# Patient Record
Sex: Male | Born: 1975 | Race: Black or African American | Hispanic: No | Smoking: Never smoker
Health system: Southern US, Community
[De-identification: ages and names within clinical notes are randomized; demographics above are authoritative.]

## PROBLEM LIST (undated history)

## (undated) DIAGNOSIS — F32A Depression, unspecified: Secondary | ICD-10-CM

## (undated) DIAGNOSIS — E559 Vitamin D deficiency, unspecified: Secondary | ICD-10-CM

## (undated) DIAGNOSIS — F419 Anxiety disorder, unspecified: Secondary | ICD-10-CM

## (undated) HISTORY — DX: Anxiety disorder, unspecified: F41.9

## (undated) HISTORY — PX: HERNIA REPAIR: SHX51

## (undated) HISTORY — DX: Depression, unspecified: F32.A

## (undated) HISTORY — DX: Vitamin D deficiency, unspecified: E55.9

---

## 2019-09-10 ENCOUNTER — Encounter: Payer: Self-pay | Admitting: Nurse Practitioner

## 2019-09-10 ENCOUNTER — Ambulatory Visit (INDEPENDENT_AMBULATORY_CARE_PROVIDER_SITE_OTHER): Payer: 59 | Admitting: Nurse Practitioner

## 2019-09-10 VITALS — BP 138/86 | HR 76 | Temp 98.8°F | Ht 75.2 in | Wt 196.2 lb

## 2019-09-10 DIAGNOSIS — E559 Vitamin D deficiency, unspecified: Secondary | ICD-10-CM

## 2019-09-10 DIAGNOSIS — Z13228 Encounter for screening for other metabolic disorders: Secondary | ICD-10-CM | POA: Diagnosis not present

## 2019-09-10 DIAGNOSIS — R1084 Generalized abdominal pain: Secondary | ICD-10-CM

## 2019-09-10 NOTE — Progress Notes (Signed)
  Subjective:     Patient ID: Garrett Jones , male    DOB: 1975-11-27 , 43 y.o.   MRN: [id number]   Chief Complaint  Patient presents with  . Establish Care  . Abdominal Pain    HPI  Here to reestablish care - was here previously.  Has not seen any primary care.  Currently works at the [employer] works night shift.  Has a live in girlfriend.  2 children.    Had an inguinal hernia - he went to [place] to have done Dr. Cleda Mccreedy (surgeon) - [facility] in [place]. 2 years ago on the right side.  PMH - seasonal allergies, ? Irritable bowel  [facility] - mother seasonal allergies, Father - MI, COVID 54 on 07/28/2019.    Alcohol - average 1 drink per week.  No cigarettes.  Does smoke marijuana on a daily basis since College.    Abdominal Pain This is a chronic problem. The current episode started more than 1 year ago. The onset quality is gradual. The quality of the pain is a sensation of fullness. Pertinent negatives include no belching, constipation, fever, headaches, nausea or vomiting. The pain is aggravated by drinking alcohol and eating. The pain is relieved by passing flatus. Treatments tried: probiotic.     History reviewed. No pertinent past medical history.   Family History  Problem Relation Age of Onset  . Allergies Mother   . Heart disease Father      Current Outpatient Medications:  .  Probiotic Product (PROBIOTIC DAILY PO), Take 1 tablet by mouth daily., Disp: , Rfl:    No Known Allergies   Review of Systems  Constitutional: Negative.  Negative for fever.  Respiratory: Negative.   Cardiovascular: Negative.  Negative for chest pain, palpitations and leg swelling.  Gastrointestinal: Negative for abdominal pain, constipation, nausea and vomiting.       Abdominal discomfort and feels like does not empty all the way after having a bowel movement.   Neurological: Negative for dizziness and headaches.  Psychiatric/Behavioral: Negative.      Today's Vitals   09/10/19  1535  BP: 138/86  Pulse: 76  Temp: 98.8 F (37.1 C)  TempSrc: Oral  SpO2: 98%  Weight: 196 lb 3.2 oz (89 kg)  Height: 6' 3.2" (1.91 m)  PainSc: 0-No pain   Body mass index is 24.39 kg/m.   Objective:  Physical Exam Constitutional:      Appearance: He is well-developed.  Neurological:     Mental Status: He is alert.  Psychiatric:        Mood and Affect: Mood normal.        Behavior: Behavior normal.         Assessment And Plan:  1. Vitamin D deficiency  He has a previous history of low vitamin d  Will check vitamin D level and supplement as needed.     Also encouraged to spend 15 minutes in the sun daily.  - Vitamin D (25 hydroxy)  2. Encounter for screening for metabolic disorder  - JQD64+RCVK - TSH - CBC without diff  3. Abdominal discomfort, generalized  Reports not feeling like he is emptying of stool completely  Will refer to GI for further evaluation  I have encouraged him to continue taking the probiotic daily and increase water intake.  Minette Brine, FNP    THE PATIENT IS ENCOURAGED TO PRACTICE SOCIAL DISTANCING DUE TO THE COVID-19 PANDEMIC.

## 2019-09-11 LAB — CMP14+EGFR
ALT: 16 IU/L (ref 0–44)
AST: 19 IU/L (ref 0–40)
Albumin/Globulin Ratio: 1.8 (ref 1.2–2.2)
Albumin: 4.1 g/dL (ref 4.0–5.0)
Alkaline Phosphatase: 75 IU/L (ref 39–117)
BUN/Creatinine Ratio: 12 (ref 9–20)
BUN: 15 mg/dL (ref 6–24)
Bilirubin Total: 0.4 mg/dL (ref 0.0–1.2)
CO2: 26 mmol/L (ref 20–29)
Calcium: 9.2 mg/dL (ref 8.7–10.2)
Chloride: 105 mmol/L (ref 96–106)
Creatinine, Ser: 1.29 mg/dL — ABNORMAL HIGH (ref 0.76–1.27)
GFR calc Af Amer: 77 mL/min/{1.73_m2} (ref >59)
GFR calc non Af Amer: 67 mL/min/{1.73_m2} (ref >59)
Globulin, Total: 2.3 g/dL (ref 1.5–4.5)
Glucose: 98 mg/dL (ref 65–99)
Potassium: 3.9 mmol/L (ref 3.5–5.2)
Sodium: 143 mmol/L (ref 134–144)
Total Protein: 6.4 g/dL (ref 6.0–8.5)

## 2019-09-11 LAB — CBC
Hematocrit: 42.7 % (ref 37.5–51.0)
Hemoglobin: 14.5 g/dL (ref 13.0–17.7)
MCH: 28.5 pg (ref 26.6–33.0)
MCHC: 34 g/dL (ref 31.5–35.7)
MCV: 84 fL (ref 79–97)
Platelets: 138 10*3/uL — ABNORMAL LOW (ref 150–450)
RBC: 5.09 x10E6/uL (ref 4.14–5.80)
RDW: 12.7 % (ref 11.6–15.4)
WBC: 5 10*3/uL (ref 3.4–10.8)

## 2019-09-11 LAB — VITAMIN D 25 HYDROXY (VIT D DEFICIENCY, FRACTURES): Vit D, 25-Hydroxy: 22.8 ng/mL — ABNORMAL LOW (ref 30.0–100.0)

## 2019-09-11 LAB — TSH: TSH: 1.97 u[IU]/mL (ref 0.450–4.500)

## 2019-09-14 MED ORDER — VITAMIN D (ERGOCALCIFEROL) 1.25 MG (50000 UNIT) PO CAPS: 50000.0000 [IU] | ORAL_CAPSULE | ORAL | 0 refills | Status: DC

## 2019-09-20 ENCOUNTER — Encounter: Payer: Self-pay | Admitting: Nurse Practitioner

## 2019-09-23 ENCOUNTER — Encounter: Payer: Self-pay | Admitting: Nurse Practitioner

## 2019-10-09 ENCOUNTER — Ambulatory Visit: Payer: Self-pay | Admitting: Nurse Practitioner

## 2019-10-21 ENCOUNTER — Ambulatory Visit (INDEPENDENT_AMBULATORY_CARE_PROVIDER_SITE_OTHER): Payer: 59 | Admitting: Nurse Practitioner

## 2019-10-21 ENCOUNTER — Other Ambulatory Visit (HOSPITAL_COMMUNITY)
Admission: RE | Admit: 2019-10-21 | Discharge: 2019-10-21 | Disposition: A | Discharge status: Home / Self Care | Payer: 59 | Source: Ambulatory Visit | Attending: Nurse Practitioner | Admitting: Nurse Practitioner

## 2019-10-21 ENCOUNTER — Other Ambulatory Visit: Payer: Self-pay

## 2019-10-21 ENCOUNTER — Encounter: Payer: Self-pay | Admitting: Nurse Practitioner

## 2019-10-21 VITALS — BP 126/82 | HR 76 | Temp 98.1°F | Ht 76.0 in | Wt 193.6 lb

## 2019-10-21 DIAGNOSIS — E559 Vitamin D deficiency, unspecified: Secondary | ICD-10-CM | POA: Diagnosis not present

## 2019-10-21 DIAGNOSIS — Z0000 Encounter for general adult medical examination without abnormal findings: Secondary | ICD-10-CM

## 2019-10-21 DIAGNOSIS — R3911 Hesitancy of micturition: Secondary | ICD-10-CM | POA: Insufficient documentation

## 2019-10-21 DIAGNOSIS — K649 Unspecified hemorrhoids: Secondary | ICD-10-CM

## 2019-10-21 DIAGNOSIS — Z125 Encounter for screening for malignant neoplasm of prostate: Secondary | ICD-10-CM

## 2019-10-21 DIAGNOSIS — R5383 Other fatigue: Secondary | ICD-10-CM

## 2019-10-21 DIAGNOSIS — Z1211 Encounter for screening for malignant neoplasm of colon: Secondary | ICD-10-CM | POA: Diagnosis not present

## 2019-10-21 HISTORY — DX: Hesitancy of micturition: R39.11

## 2019-10-21 HISTORY — DX: Unspecified hemorrhoids: K64.9

## 2019-10-21 HISTORY — DX: Other fatigue: R53.83

## 2019-10-21 LAB — POCT URINALYSIS DIPSTICK
Bilirubin, UA: NEGATIVE
Blood, UA: NEGATIVE
Glucose, UA: NEGATIVE
Ketones, UA: NEGATIVE
Nitrite, UA: NEGATIVE
Protein, UA: NEGATIVE
Spec Grav, UA: 1.025 (ref 1.010–1.025)
Urobilinogen, UA: 4 E.U./dL — AB
pH, UA: 7 (ref 5.0–8.0)

## 2019-10-21 NOTE — Patient Instructions (Signed)
Health Maintenance  Topic Date Due  . INFLUENZA VACCINE  02/03/2020 (Originally 06/06/2019)  . TETANUS/TDAP  07/17/2025  . HIV Screening  Completed   Health Maintenance, Male Adopting a healthy lifestyle and getting preventive care are important in promoting health and wellness. Ask your health care provider about:  The right schedule for you to have regular tests and exams.  Things you can do on your own to prevent diseases and keep yourself healthy. What should I know about diet, weight, and exercise? Eat a healthy diet   Eat a diet that includes plenty of vegetables, fruits, low-fat dairy products, and lean protein.  Do not eat a lot of foods that are high in solid fats, added sugars, or sodium. Maintain a healthy weight Body mass index (BMI) is a measurement that can be used to identify possible weight problems. It estimates body fat based on height and weight. Your health care provider can help determine your BMI and help you achieve or maintain a healthy weight. Get regular exercise Get regular exercise. This is one of the most important things you can do for your health. Most adults should:  Exercise for at least 150 minutes each week. The exercise should increase your heart rate and make you sweat (moderate-intensity exercise).  Do strengthening exercises at least twice a week. This is in addition to the moderate-intensity exercise.  Spend less time sitting. Even light physical activity can be beneficial. Watch cholesterol and blood lipids Have your blood tested for lipids and cholesterol at 44 years of age, then have this test every 5 years. You may need to have your cholesterol levels checked more often if:  Your lipid or cholesterol levels are high.  You are older than 44 years of age.  You are at high risk for heart disease. What should I know about cancer screening? Many types of cancers can be detected early and may often be prevented. Depending on your health  history and family history, you may need to have cancer screening at various ages. This may include screening for:  Colorectal cancer.  Prostate cancer.  Skin cancer.  Lung cancer. What should I know about heart disease, diabetes, and high blood pressure? Blood pressure and heart disease  High blood pressure causes heart disease and increases the risk of stroke. This is more likely to develop in people who have high blood pressure readings, are of African descent, or are overweight.  Talk with your health care provider about your target blood pressure readings.  Have your blood pressure checked: ? Every 3-5 years if you are 19-46 years of age. ? Every year if you are 45 years old or older.  If you are between the ages of 63 and 22 and are a current or former smoker, ask your health care provider if you should have a one-time screening for abdominal aortic aneurysm (AAA). Diabetes Have regular diabetes screenings. This checks your fasting blood sugar level. Have the screening done:  Once every three years after age 46 if you are at a normal weight and have a low risk for diabetes.  More often and at a younger age if you are overweight or have a high risk for diabetes. What should I know about preventing infection? Hepatitis B If you have a higher risk for hepatitis B, you should be screened for this virus. Talk with your health care provider to find out if you are at risk for hepatitis B infection. Hepatitis C Blood testing is recommended for:  Everyone born from [birth year] through 1965.  Anyone with known risk factors for hepatitis C. Sexually transmitted infections (STIs)  You should be screened each year for STIs, including gonorrhea and chlamydia, if: ? You are sexually active and are younger than 44 years of age. ? You are older than 44 years of age and your health care provider tells you that you are at risk for this type of infection. ? Your sexual activity has changed since  you were last screened, and you are at increased risk for chlamydia or gonorrhea. Ask your health care provider if you are at risk.  Ask your health care provider about whether you are at high risk for HIV. Your health care provider may recommend a prescription medicine to help prevent HIV infection. If you choose to take medicine to prevent HIV, you should first get tested for HIV. You should then be tested every 3 months for as long as you are taking the medicine. Follow these instructions at home: Lifestyle  Do not use any products that contain nicotine or tobacco, such as cigarettes, e-cigarettes, and chewing tobacco. If you need help quitting, ask your health care provider.  Do not use street drugs.  Do not share needles.  Ask your health care provider for help if you need support or information about quitting drugs. Alcohol use  Do not drink alcohol if your health care provider tells you not to drink.  If you drink alcohol: ? Limit how much you have to 0-2 drinks a day. ? Be aware of how much alcohol is in your drink. In the [place] one drink equals one 12 oz bottle of beer (355 mL), one 5 oz glass of wine (148 mL), or one 1 oz glass of hard liquor (44 mL). General instructions  Schedule regular health, dental, and eye exams.  Stay current with your vaccines.  Tell your health care provider if: ? You often feel depressed. ? You have ever been abused or do not feel safe at home. Summary  Adopting a healthy lifestyle and getting preventive care are important in promoting health and wellness.  Follow your health care provider's instructions about healthy diet, exercising, and getting tested or screened for diseases.  Follow your health care provider's instructions on monitoring your cholesterol and blood pressure. This information is not intended to replace advice given to you by your health care provider. Make sure you discuss any questions you have with your health care  provider. Document Released: 04/19/2008 Document Revised: 10/15/2018 Document Reviewed: 10/15/2018 Elsevier Patient Education  2020 [employer].

## 2019-10-21 NOTE — Progress Notes (Signed)
This visit occurred during the SARS-CoV-2 public health emergency.  Safety protocols were in place, including screening questions prior to the visit, additional usage of staff PPE, and extensive cleaning of exam room while observing appropriate contact time as indicated for disinfecting solutions.  Subjective:     Patient ID: Garrett Jones , male    DOB: 16-Nov-1975 , 43 y.o.   MRN: [id number]   Chief Complaint  Patient presents with  . Annual Exam    HPI  Here for HM.  He works for [employer].    Wt Readings from Last 3 Encounters: 10/21/19 : 193 lb 9.6 oz (87.8 kg) 09/10/19 : 196 lb 3.2 oz (89 kg)  He has not been to GI doctor.  He had an episode of hemorrhoids 2 weeks ago.      Men's preventive visit. Patient Health Questionnaire (PHQ-2) is    Office Visit from 10/21/2019 in [facility]  PHQ-2 Total Score  0     Patient is on a regular diet. Minimal exercise.    Marital status: Significant Other. Relevant history for alcohol use is:  Social History   Substance and Sexual Activity  Alcohol Use Yes  . Alcohol/week: 3.0 standard drinks  . Types: 3 Cans of beer per week   Comment: ocassionally   Relevant history for tobacco use is:  Social History   Tobacco Use  Smoking Status Never Smoker  Smokeless Tobacco Never Used   No past medical history on file.   Family History  Problem Relation Age of Onset  . Allergies Mother   . Heart disease Father      Current Outpatient Medications:  .  Probiotic Product (PROBIOTIC DAILY PO), Take 1 tablet by mouth daily., Disp: , Rfl:  .  Vitamin D, Ergocalciferol, (DRISDOL) 1.25 MG (50000 UT) CAPS capsule, Take 1 capsule (50,000 Units total) by mouth every 7 (seven) days., Disp: 12 capsule, Rfl: 0   No Known Allergies   Review of Systems  Constitutional: Negative.   HENT: Negative.   Eyes: Negative.   Respiratory: Negative.   Cardiovascular: Negative.  Negative for chest pain, palpitations and leg  swelling.  Gastrointestinal: Negative.   Endocrine: Negative.   Genitourinary: Negative.   Musculoskeletal: Negative.   Skin: Negative.   Neurological: Negative.   Hematological: Negative.   Psychiatric/Behavioral: Negative.      Today's Vitals   10/21/19 1454  BP: 126/82  Pulse: 76  Temp: 98.1 F (36.7 C)  TempSrc: Oral  Weight: 193 lb 9.6 oz (87.8 kg)  Height: 6\' 4"  (1.93 m)  PainSc: 0-No pain   Body mass index is 23.57 kg/m.   Objective:  Physical Exam Vitals reviewed.  Constitutional:      General: He is not in acute distress.    Appearance: Normal appearance.  HENT:     Head: Normocephalic and atraumatic.     Right Ear: Tympanic membrane, ear canal and external ear normal. There is no impacted cerumen.     Left Ear: Tympanic membrane, ear canal and external ear normal. There is no impacted cerumen.     Nose: Nose normal.  Cardiovascular:     Rate and Rhythm: Normal rate and regular rhythm.     Pulses: Normal pulses.     Heart sounds: Normal heart sounds. No murmur.  Pulmonary:     Effort: Pulmonary effort is normal. No respiratory distress.     Breath sounds: Normal breath sounds.  Abdominal:     General: Abdomen  is flat. Bowel sounds are normal. There is no distension.     Palpations: Abdomen is soft.  Genitourinary:    Prostate: Normal.     Rectum: Guaiac result negative.  Musculoskeletal:        General: No tenderness. Normal range of motion.     Cervical back: Normal range of motion and neck supple.  Skin:    General: Skin is warm.     Capillary Refill: Capillary refill takes less than 2 seconds.  Neurological:     General: No focal deficit present.     Mental Status: He is alert and oriented to person, place, and time.  Psychiatric:        Mood and Affect: Mood normal.        Behavior: Behavior normal.        Thought Content: Thought content normal.        Judgment: Judgment normal.         Assessment And Plan:   1. Health maintenance  examination . Behavior modifications discussed and diet history reviewed.   . Pt will continue to exercise regularly and modify diet with low GI, plant based foods and decrease intake of processed foods.  . Recommend intake of daily multivitamin, Vitamin D, and calcium.  . Recommend for preventive screenings, as well as recommend immunizations that include influenza, TDAP - POCT Urinalysis Dipstick (81002)  2. Vitamin D deficiency  Will check vitamin D level and supplement as needed.     Also encouraged to spend 15 minutes in the sun daily.   3. Hemorrhoids, unspecified hemorrhoid type  Manual exam done, unable to feel any hemorrhoids  4. 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 Encounter for prostate cancer screening  Manual prostate exam normal - PSA  6. Urinary hesitancy  Will check cytology and will check PSA to ensure not related to enlargement - Urine cytology ancillary only     [name], FNP    THE PATIENT IS ENCOURAGED TO PRACTICE SOCIAL DISTANCING DUE TO THE COVID-19 PANDEMIC.

## 2019-10-22 ENCOUNTER — Encounter: Payer: Self-pay | Admitting: General Surgery

## 2019-10-22 ENCOUNTER — Telehealth: Payer: Self-pay | Admitting: General Surgery

## 2019-10-22 ENCOUNTER — Ambulatory Visit (INDEPENDENT_AMBULATORY_CARE_PROVIDER_SITE_OTHER): Payer: 59 | Admitting: Physician Assistant

## 2019-10-22 ENCOUNTER — Encounter: Payer: Self-pay | Admitting: Physician Assistant

## 2019-10-22 VITALS — BP 122/60 | HR 74 | Temp 97.3°F | Ht 76.0 in | Wt 193.0 lb

## 2019-10-22 DIAGNOSIS — K921 Melena: Secondary | ICD-10-CM

## 2019-10-22 DIAGNOSIS — K649 Unspecified hemorrhoids: Secondary | ICD-10-CM

## 2019-10-22 DIAGNOSIS — K625 Hemorrhage of anus and rectum: Secondary | ICD-10-CM

## 2019-10-22 DIAGNOSIS — Z1211 Encounter for screening for malignant neoplasm of colon: Secondary | ICD-10-CM

## 2019-10-22 DIAGNOSIS — Z1159 Encounter for screening for other viral diseases: Secondary | ICD-10-CM

## 2019-10-22 LAB — VITAMIN B12: Vitamin B-12: 547 pg/mL (ref 232–1245)

## 2019-10-22 LAB — PSA: Prostate Specific Ag, Serum: 0.5 ng/mL (ref 0.0–4.0)

## 2019-10-22 LAB — TESTOSTERONE: Testosterone: 432 ng/dL (ref 264–916)

## 2019-10-22 MED ORDER — NA SULFATE-K SULFATE-MG SULF 17.5-3.13-1.6 GM/177ML PO SOLN: 1.0000 | Freq: Once | ORAL | 0 refills | Status: AC

## 2019-10-22 NOTE — Patient Instructions (Signed)
If you are age 44 or older, your body mass index should be between 23-30. Your Body mass index is 23.49 kg/m. If this is out of the aforementioned range listed, please consider follow up with your Primary Care Provider.  If you are age 77 or younger, your body mass index should be between 19-25. Your Body mass index is 23.49 kg/m. If this is out of the aformentioned range listed, please consider follow up with your Primary Care Provider.   You have been scheduled for a colonoscopy. Please follow written instructions given to you at your visit today.  Please pick up your prep supplies at the pharmacy within the next 1-3 days. If you use inhalers (even only as needed), please bring them with you on the day of your procedure.  Try and use over the counter Recticare 3-4 times a dayas needed for hemrroids.  Thank you for choosing me and [facility]

## 2019-10-22 NOTE — Progress Notes (Signed)
Subjective:    Patient ID: Garrett Jones, male    DOB: [birth year], 43 y.o.   MRN: [id number]  HPI Garrett Jones is a pleasant 42 year old African-American male, new to GI today referred by Delfino Lovett, FNP for change in bowel habits and scant hematochezia. Patient has not had any prior GI evaluation. He did undergo right inguinal hernia repair in 2019. Family history is negative for colon cancer and polyps as far as he is aware. Patient says that he had an episode with a small amount of bright red blood noted on the tissue a couple of weeks ago.  He is also had some recent anal rectal burning which he thinks is secondary to hemorrhoids. He had been having some stomach upset which he describes as an unsettled feeling and somewhat more frequent bowel movements recently.  He started taking a probiotic which he feels is definitely helping.  He has had a sense of incomplete bowel evacuation recently, no constipation.  Review of Systems Pertinent positive and negative review of systems were noted in the above HPI section.  All other review of systems was otherwise negative.  Outpatient Encounter Medications as of 10/22/2019  Medication Sig  . Probiotic Product (PROBIOTIC DAILY PO) Take 1 tablet by mouth daily.  . Vitamin D, Ergocalciferol, (DRISDOL) 1.25 MG (50000 UT) CAPS capsule Take 1 capsule (50,000 Units total) by mouth every 7 (seven) days.  . Na Sulfate-K Sulfate-Mg Sulf 17.5-3.13-1.6 GM/177ML SOLN Take 1 kit by mouth once for 1 dose.   No facility-administered encounter medications on file as of 10/22/2019.   No Known Allergies Patient Active Problem List   Diagnosis Date Noted  . Urinary hesitancy 10/21/2019  . Fatigue 10/21/2019  . Hemorrhoids 10/21/2019  . Vitamin D deficiency 10/21/2019   Social History   Socioeconomic History  . Marital status: Significant Other    Spouse name: Not on file  . Number of children: 2  . Years of education: Not on file  . Highest education level:  Not on file  Occupational History  . Occupation: Scientist, research (medical)   Tobacco Use  . Smoking status: Never Smoker  . Smokeless tobacco: Never Used  Substance and Sexual Activity  . Alcohol use: Yes    Alcohol/week: 3.0 standard drinks    Types: 3 Cans of beer per week    Comment: ocassionally  . Drug use: Yes    Frequency: 7.0 times per week    Types: Marijuana  . Sexual activity: Not on file  Other Topics Concern  . Not on file  Social History Narrative  . Not on file   Social Determinants of Health   Financial Resource Strain:   . Difficulty of Paying Living Expenses: Not on file  Food Insecurity:   . Worried About Charity fundraiser in the Last Year: Not on file  . Ran Out of Food in the Last Year: Not on file  Transportation Needs:   . Lack of Transportation (Medical): Not on file  . Lack of Transportation (Non-Medical): Not on file  Physical Activity:   . Days of Exercise per Week: Not on file  . Minutes of Exercise per Session: Not on file  Stress:   . Feeling of Stress : Not on file  Social Connections:   . Frequency of Communication with Friends and Family: Not on file  . Frequency of Social Gatherings with Friends and Family: Not on file  . Attends Religious Services: Not on file  . Active Member of  Clubs or Organizations: Not on file  . Attends Archivist Meetings: Not on file  . Marital Status: Not on file  Intimate Partner Violence:   . Fear of Current or Ex-Partner: Not on file  . Emotionally Abused: Not on file  . Physically Abused: Not on file  . Sexually Abused: Not on file    Garrett Jones's family history includes Allergies in his mother; Heart disease in his father; Hypertension in his father; Prostate cancer in his maternal grandfather.      Objective:    Vitals:   10/22/19 1502  BP: 122/60  Pulse: 74  Temp: (!) 97.3 F (36.3 C)    Physical Exam Well-developed well-nourished African-American  male in no acute distress.   Weight 193,  BMI 23.4  HEENT; nontraumatic normocephalic, EOMI, PE R LA, sclera anicteric. Oropharynx; not examined/mask/Covid Neck; supple, no JVD Cardiovascular; regular rate and rhythm with S1-S2, no murmur rub or gallop Pulmonary; Clear bilaterally Abdomen; soft, nontender, nondistended, no palpable mass or hepatosplenomegaly, bowel sounds are active Rectal; not done today Skin; benign exam, no jaundice rash or appreciable lesions Extremities; no clubbing cyanosis or edema skin warm and dry Neuro/Psych; alert and oriented x4, grossly nonfocal mood and affect appropriate       Assessment & Plan:   #54 43 year old African-American male with recent mild change in bowel habits with somewhat more frequent bowel movements and and on settled feeling in the abdomen which has improved with probiotics. Intermittent incomplete bowel evacuation and one recent episode of small-volume hematochezia.  Hematochezia may have been secondary to hemorrhoids, rule out occult colon lesion.  #2 status post right inguinal hernia repair  Plan; continue daily probiotic. Patient was advised to try RectiCare OTC for hemorrhoidal symptoms, 3-4 times daily as needed Patient will be scheduled for colonoscopy with Dr. Scarlette Shorts.  Procedure was discussed in detail with the patient including indications risks and benefits and he is agreeable to proceed  Alfredia Ferguson PA-C 10/22/2019   Cc: Minette Brine, FNP

## 2019-10-23 LAB — URINE CYTOLOGY ANCILLARY ONLY
Chlamydia: NEGATIVE
Comment: NEGATIVE
Comment: NEGATIVE
Comment: NORMAL
Neisseria Gonorrhea: NEGATIVE
Trichomonas: NEGATIVE

## 2019-10-23 NOTE — Progress Notes (Signed)
Assessment and plan reviewed 

## 2019-10-26 NOTE — Telephone Encounter (Signed)
Error

## 2019-10-26 NOTE — Telephone Encounter (Signed)
error 

## 2019-10-27 ENCOUNTER — Encounter: Payer: Self-pay | Admitting: Internal Medicine

## 2019-10-27 ENCOUNTER — Ambulatory Visit (INDEPENDENT_AMBULATORY_CARE_PROVIDER_SITE_OTHER): Payer: 59

## 2019-10-27 DIAGNOSIS — Z1159 Encounter for screening for other viral diseases: Secondary | ICD-10-CM

## 2019-10-28 LAB — SARS CORONAVIRUS 2 (TAT 6-24 HRS): SARS Coronavirus 2: NEGATIVE

## 2019-10-29 ENCOUNTER — Encounter: Payer: Self-pay | Admitting: Internal Medicine

## 2019-10-29 ENCOUNTER — Other Ambulatory Visit: Payer: Self-pay

## 2019-10-29 ENCOUNTER — Ambulatory Visit (AMBULATORY_SURGERY_CENTER): Payer: 59 | Admitting: Internal Medicine

## 2019-10-29 VITALS — BP 133/86 | HR 65 | Temp 99.3°F | Resp 15 | Ht 76.0 in | Wt 193.0 lb

## 2019-10-29 DIAGNOSIS — R194 Change in bowel habit: Secondary | ICD-10-CM | POA: Diagnosis not present

## 2019-10-29 DIAGNOSIS — K625 Hemorrhage of anus and rectum: Secondary | ICD-10-CM | POA: Diagnosis not present

## 2019-10-29 DIAGNOSIS — Z1211 Encounter for screening for malignant neoplasm of colon: Secondary | ICD-10-CM

## 2019-10-29 MED ORDER — SODIUM CHLORIDE 0.9 % IV SOLN: 500.0000 mL | Freq: Once | INTRAVENOUS | Status: DC

## 2019-10-29 NOTE — Patient Instructions (Addendum)
Handouts given for hemorrhoids and high fiber diet.  YOU HAD AN ENDOSCOPIC PROCEDURE TODAY AT [facility]:   Refer to the procedure report that was given to you for any specific questions about what was found during the examination.  If the procedure report does not answer your questions, please call your gastroenterologist to clarify.  If you requested that your care partner not be given the details of your procedure findings, then the procedure report has been included in a sealed envelope for you to review at your convenience later.  YOU SHOULD EXPECT: Some feelings of bloating in the abdomen. Passage of more gas than usual.  Walking can help get rid of the air that was put into your GI tract during the procedure and reduce the bloating. If you had a lower endoscopy (such as a colonoscopy or flexible sigmoidoscopy) you may notice spotting of blood in your stool or on the toilet paper. If you underwent a bowel prep for your procedure, you may not have a normal bowel movement for a few days.  Please Note:  You might notice some irritation and congestion in your nose or some drainage.  This is from the oxygen used during your procedure.  There is no need for concern and it should clear up in a day or so.  SYMPTOMS TO REPORT IMMEDIATELY:   Following lower endoscopy (colonoscopy or flexible sigmoidoscopy):  Excessive amounts of blood in the stool  Significant tenderness or worsening of abdominal pains  Swelling of the abdomen that is new, acute  Fever of 100F or higher  For urgent or emergent issues, a gastroenterologist can be reached at any hour by calling [phone].   DIET:  We do recommend a small meal at first, but then you may proceed to your regular diet.  Drink plenty of fluids but you should avoid alcoholic beverages for 24 hours.  ACTIVITY:  You should plan to take it easy for the rest of today and you should NOT DRIVE or use heavy machinery until tomorrow  (because of the sedation medicines used during the test).    FOLLOW UP: Our staff will call the number listed on your records 48-72 hours following your procedure to check on you and address any questions or concerns that you may have regarding the information given to you following your procedure. If we do not reach you, we will leave a message.  We will attempt to reach you two times.  During this call, we will ask if you have developed any symptoms of COVID 19. If you develop any symptoms (ie: fever, flu-like symptoms, shortness of breath, cough etc.) before then, please call [phone].  If you test positive for Covid 19 in the 2 weeks post procedure, please call and report this information to [place].    If any biopsies were taken you will be contacted by phone or by letter within the next 1-3 weeks.  Please call us at [phone] if you have not heard about the biopsies in 3 weeks.    SIGNATURES/CONFIDENTIALITY: You and/or your care partner have signed paperwork which will be entered into your electronic medical record.  These signatures attest to the fact that that the information above on your After Visit Summary has been reviewed and is understood.  Full responsibility of the confidentiality of this discharge information lies with you and/or your care-partner.

## 2019-10-29 NOTE — Progress Notes (Signed)
Temp-JB VS-DT 

## 2019-10-29 NOTE — Op Note (Signed)
[facility] Patient Name: Garrett Jones Procedure Date: 10/29/2019 10:41 AM MRN: [id number] Endoscopist: Wilhemina Bonito. Marina Goodell , MD Age: 43 Referring MD:  Date of Birth: 11-22-1975 Gender: Male Account #: [id number] Procedure:                Colonoscopy Indications:              Colon cancer screening. Average risk. Incidental                            minor rectal bleeding, Incidental change in bowel                            habits noted. Improved since office visit Medicines:                Monitored Anesthesia Care Procedure:                Pre-Anesthesia Assessment:                           - Prior to the procedure, a History and Physical                            was performed, and patient medications and                            allergies were reviewed. The patient's tolerance of                            previous anesthesia was also reviewed. The risks                            and benefits of the procedure and the sedation                            options and risks were discussed with the patient.                            All questions were answered, and informed consent                            was obtained. Prior Anticoagulants: The patient has                            taken no previous anticoagulant or antiplatelet                            agents. ASA Grade Assessment: I - A normal, healthy                            patient. After reviewing the risks and benefits,                            the patient was deemed in satisfactory condition to  undergo the procedure.                           After obtaining informed consent, the colonoscope                            was passed under direct vision. Throughout the                            procedure, the patient's blood pressure, pulse, and                            oxygen saturations were monitored continuously. The                            Colonoscope was introduced  through the anus and                            advanced to the the cecum, identified by                            appendiceal orifice and ileocecal valve. The                            ileocecal valve, appendiceal orifice, and rectum                            were photographed. The quality of the bowel                            preparation was excellent. The colonoscopy was                            performed without difficulty. The patient tolerated                            the procedure well. The bowel preparation used was                            SUPREP via split dose instruction. Scope In: 10:56:26 AM Scope Out: 11:09:35 AM Scope Withdrawal Time: 0 hours 9 minutes 13 seconds  Total Procedure Duration: 0 hours 13 minutes 9 seconds  Findings:                 The entire examined colon appeared normal on direct                            and retroflexion views. Internal hemorrhoids present Complications:            No immediate complications. Estimated blood loss:                            None. Estimated Blood Loss:     Estimated blood loss: none. Impression:               - The entire examined  colon is normal on direct and                            retroflexion views. Internal hemorrhoids present                           - No specimens collected. Recommendation:           - Repeat colonoscopy in 10 years for screening                            purposes.                           - Patient has a contact number available for                            emergencies. The signs and symptoms of potential                            delayed complications were discussed with the                            patient. Return to normal activities tomorrow.                            Written discharge instructions were provided to the                            patient.                           - Resume previous diet.                           - Continue present medications. Docia Chuck.  Henrene Pastor, MD 10/29/2019 11:18:10 AM This report has been signed electronically.

## 2019-10-29 NOTE — Progress Notes (Signed)
Report to PACU, RN, vss, BBS= Clear.  

## 2019-11-03 ENCOUNTER — Telehealth: Payer: Self-pay

## 2019-11-03 NOTE — Telephone Encounter (Signed)
  Follow up Call-  Call back number 10/29/2019  Post procedure Call Back phone  # [phone]  Permission to leave phone message Yes  Some recent data might be hidden     Patient questions:  Do you have a fever, pain , or abdominal swelling? No. Pain Score  0 *  Have you tolerated food without any problems? Yes.    Have you been able to return to your normal activities? Yes.    Do you have any questions about your discharge instructions: Diet   No. Medications  No. Follow up visit  No.  Do you have questions or concerns about your Care? No.  Actions: * If pain score is 4 or above: No action needed, pain <4. 1. Have you developed a fever since your procedure? no  2.   Have you had an respiratory symptoms (SOB or cough) since your procedure? no  3.   Have you tested positive for COVID 19 since your procedure no  4.   Have you had any family members/close contacts diagnosed with the COVID 19 since your procedure?  no   If yes to any of these questions please route to Joylene John, RN and Alphonsa Gin, Therapist, sports.

## 2019-11-03 NOTE — Telephone Encounter (Signed)
Mailbox was full

## 2019-12-04 ENCOUNTER — Other Ambulatory Visit: Payer: Self-pay | Admitting: Nurse Practitioner

## 2020-01-19 ENCOUNTER — Encounter: Payer: Self-pay | Admitting: Nurse Practitioner

## 2020-01-19 ENCOUNTER — Other Ambulatory Visit: Payer: Self-pay

## 2020-01-19 ENCOUNTER — Ambulatory Visit (INDEPENDENT_AMBULATORY_CARE_PROVIDER_SITE_OTHER): Payer: 59 | Admitting: Nurse Practitioner

## 2020-01-19 VITALS — BP 122/80 | HR 66 | Temp 98.2°F | Ht 76.0 in | Wt 199.3 lb

## 2020-01-19 DIAGNOSIS — E559 Vitamin D deficiency, unspecified: Secondary | ICD-10-CM

## 2020-01-19 DIAGNOSIS — R41840 Attention and concentration deficit: Secondary | ICD-10-CM | POA: Diagnosis not present

## 2020-01-19 DIAGNOSIS — F4321 Adjustment disorder with depressed mood: Secondary | ICD-10-CM | POA: Diagnosis not present

## 2020-01-19 MED ORDER — MAGNESIUM 250 MG PO TABS: ORAL_TABLET | ORAL | 2 refills | Status: DC

## 2020-01-19 NOTE — Progress Notes (Signed)
This visit occurred during the SARS-CoV-2 public health emergency.  Safety protocols were in place, including screening questions prior to the visit, additional usage of staff PPE, and extensive cleaning of exam room while observing appropriate contact time as indicated for disinfecting solutions.  Subjective:     Patient ID: Garrett Jones , male    DOB: 01-20-1976 , 44 y.o.   MRN: [id number]   Chief Complaint  Patient presents with  . vitamin d    patient is here for a 3 month f/u     HPI  Here for vitamin d recheck.     Past Medical History:  Diagnosis Date  . Vitamin D deficiency      Family History  Problem Relation Age of Onset  . Allergies Mother   . Heart disease Father   . Hypertension Father   . Prostate cancer Maternal Grandfather   . Colon cancer Neg Hx   . Esophageal cancer Neg Hx   . Rectal cancer Neg Hx   . Stomach cancer Neg Hx      Current Outpatient Medications:  .  Probiotic Product (PROBIOTIC DAILY PO), Take 1 tablet by mouth daily., Disp: , Rfl:  .  Vitamin D, Ergocalciferol, (DRISDOL) 1.25 MG (50000 UNIT) CAPS capsule, TAKE 1 CAPSULE BY MOUTH EVERY 7 DAYS, Disp: 12 capsule, Rfl: 0   No Known Allergies   Review of Systems   Today's Vitals   01/19/20 1001  BP: 122/80  Pulse: 66  Temp: 98.2 F (36.8 C)  TempSrc: Oral  Weight: 199 lb 4.8 oz (90.4 kg)  Height: 6\' 4"  (1.93 m)  PainSc: 0-No pain   Body mass index is 24.26 kg/m.   Objective:  Physical Exam Constitutional:      Appearance: Normal appearance.  Cardiovascular:     Rate and Rhythm: Normal rate and regular rhythm.     Pulses: Normal pulses.     Heart sounds: Normal heart sounds. No murmur.  Pulmonary:     Effort: Pulmonary effort is normal. No respiratory distress.     Breath sounds: Normal breath sounds.  Skin:    Capillary Refill: Capillary refill takes less than 2 seconds.  Neurological:     General: No focal deficit present.     Mental Status: He is alert and  oriented to person, place, and time.     Cranial Nerves: No cranial nerve deficit.  Psychiatric:        Mood and Affect: Mood normal.        Behavior: Behavior normal.        Thought Content: Thought content normal.        Judgment: Judgment normal.     Comments: He is tearful during the visit          Assessment And Plan:     1. Vitamin D deficiency  Chronic, will recheck vitamin d level - VITAMIN D 25 Hydroxy (Vit-D Deficiency, Fractures)  2. Concentration deficit  Will have challenges with remaining focused.  3. Grief  He is having a difficult time with the loss of his father, he seems to be overwhelmed and is tearful during the visit.  I have offered to take him out of work for a couple weeks to allow him some time to grieve.  I have also offered to refer him for grief counseling at this time he declines   , FNP    THE PATIENT IS ENCOURAGED TO PRACTICE SOCIAL DISTANCING DUE TO THE COVID-19 PANDEMIC.

## 2020-01-20 ENCOUNTER — Other Ambulatory Visit: Payer: Self-pay

## 2020-01-20 LAB — VITAMIN D 25 HYDROXY (VIT D DEFICIENCY, FRACTURES): Vit D, 25-Hydroxy: 56.3 ng/mL (ref 30.0–100.0)

## 2020-01-20 MED ORDER — VITAMIN D (ERGOCALCIFEROL) 1.25 MG (50000 UNIT) PO CAPS: ORAL_CAPSULE | ORAL | 0 refills | Status: DC

## 2020-02-03 ENCOUNTER — Telehealth: Payer: Self-pay

## 2020-02-03 NOTE — Telephone Encounter (Signed)
Patient notified that his forms have been completed and he can pick them up. YL,RMA

## 2020-02-23 ENCOUNTER — Other Ambulatory Visit: Payer: Self-pay | Admitting: Internal Medicine

## 2020-04-25 ENCOUNTER — Other Ambulatory Visit: Payer: Self-pay

## 2020-04-25 ENCOUNTER — Encounter: Payer: Self-pay | Admitting: Nurse Practitioner

## 2020-04-25 ENCOUNTER — Ambulatory Visit (INDEPENDENT_AMBULATORY_CARE_PROVIDER_SITE_OTHER): Payer: 59 | Admitting: Nurse Practitioner

## 2020-04-25 VITALS — BP 120/64 | HR 62 | Temp 98.0°F | Ht 76.0 in | Wt 202.4 lb

## 2020-04-25 DIAGNOSIS — M5442 Lumbago with sciatica, left side: Secondary | ICD-10-CM | POA: Diagnosis not present

## 2020-04-25 DIAGNOSIS — E559 Vitamin D deficiency, unspecified: Secondary | ICD-10-CM | POA: Diagnosis not present

## 2020-04-25 DIAGNOSIS — Z1159 Encounter for screening for other viral diseases: Secondary | ICD-10-CM

## 2020-04-25 MED ORDER — PREDNISONE 10 MG (21) PO TBPK: ORAL_TABLET | ORAL | 0 refills | Status: DC

## 2020-04-25 NOTE — Progress Notes (Signed)
This visit occurred during the SARS-CoV-2 public health emergency.  Safety protocols were in place, including screening questions prior to the visit, additional usage of staff PPE, and extensive cleaning of exam room while observing appropriate contact time as indicated for disinfecting solutions.  Subjective:     Patient ID: Garrett Jones , male    DOB: 02-19-1976 , 44 y.o.   MRN: [id number]   Chief Complaint  Patient presents with  . vitamin d recheck    HPI  Here for vitamin d recheck. He reports that he is doing well and started working out recently. He is trying to get in the sun more and is feeling better with his mood as well. He is also cutting his lawn to get more sun exposure.   He has a compliant of a lower back ache that has some radiculopathy to the left thigh.The pain stated after he started his workout routine. He has tried epsom salt and has started to go to the chiropractor. Noticed after doing an exercise while working out he pulled. He reports a pain of 3/10 that is dull and ache. He took ibuprofen but doesn't take a lot of medication and wants to get a massage but they are booked up currently. We discussed using ice and heat alternating and also taking ibuprofen for the pain. He had to take 2 weeks off, this is going on the 3rd week.      Past Medical History:  Diagnosis Date  . Vitamin D deficiency      Family History  Problem Relation Age of Onset  . Allergies Mother   . Heart disease Father   . Hypertension Father   . Prostate cancer Maternal Grandfather   . Colon cancer Neg Hx   . Esophageal cancer Neg Hx   . Rectal cancer Neg Hx   . Stomach cancer Neg Hx      Current Outpatient Medications:  Marland Kitchen  Vitamin D, Ergocalciferol, (DRISDOL) 1.25 MG (50000 UNIT) CAPS capsule, TAKE 1 CAPSULE BY MOUTH EVERY 7 DAYS, Disp: 12 capsule, Rfl: 0   No Known Allergies   Review of Systems  Constitutional: Negative.   Eyes: Negative.   Respiratory: Negative.    Cardiovascular: Negative.   Gastrointestinal: Negative.   Endocrine: Negative.   Genitourinary: Negative.   Musculoskeletal: Positive for back pain.       Dull ache on lower left side  Allergic/Immunologic: Negative.   Neurological: Positive for numbness. Negative for weakness.       Reports some dull ache and numbness to the left thigh  Psychiatric/Behavioral: Negative.      Today's Vitals   04/25/20 0958  BP: 120/64  Pulse: 62  Temp: 98 F (36.7 C)  TempSrc: Oral  Weight: 202 lb 6.4 oz (91.8 kg)  Height: 6\' 4"  (1.93 m)  PainSc: 0-No pain   Body mass index is 24.64 kg/m.   Objective:  Physical Exam Constitutional:      Appearance: Normal appearance.  Cardiovascular:     Rate and Rhythm: Normal rate and regular rhythm.     Pulses: Normal pulses.     Heart sounds: Normal heart sounds. No murmur heard.   Pulmonary:     Effort: Pulmonary effort is normal. No respiratory distress.     Breath sounds: Normal breath sounds.  Musculoskeletal:        General: Swelling and tenderness present.     Cervical back: Normal range of motion.     Lumbar back: Swelling and tenderness  present.     Comments: Lower left back  Skin:    General: Skin is warm and dry.     Capillary Refill: Capillary refill takes less than 2 seconds.  Neurological:     General: No focal deficit present.     Mental Status: He is alert and oriented to person, place, and time.     Cranial Nerves: No cranial nerve deficit.  Psychiatric:        Attention and Perception: Attention normal.        Mood and Affect: Mood and affect normal.        Behavior: Behavior normal. Behavior is cooperative.        Thought Content: Thought content normal.        Cognition and Memory: Cognition normal.        Judgment: Judgment normal.     Comments: He is reports he is feeling better with his mood today         Assessment And Plan:     1. Vitamin D deficiency  Chronic, will recheck vitamin d level - VITAMIN D 25  Hydroxy (Vit-D Deficiency, Fractures)  2. Acute left-sided low back pain with left-sided sciatica  Tenderness to left lower back since has been 2 weeks with continued pain will treat with prednisone  Advised to take in am  - predniSONE (STERAPRED UNI-PAK 21 TAB) 10 MG (21) TBPK tablet; Take as directed  Dispense: 21 tablet; Refill: 0  3. Encounter for hepatitis C screening test for low risk patient  Will check for Hepatitis C screening due to being born between the years [birth year] - Hepatitis C antibody   Marylu Lund, RN    Minette Brine, DNP, FNP-BC   THE PATIENT IS ENCOURAGED TO PRACTICE SOCIAL DISTANCING DUE TO THE COVID-19 PANDEMIC.

## 2020-04-26 LAB — VITAMIN D 25 HYDROXY (VIT D DEFICIENCY, FRACTURES): Vit D, 25-Hydroxy: 62 ng/mL (ref 30.0–100.0)

## 2020-04-26 LAB — HEPATITIS C ANTIBODY: Hep C Virus Ab: <0.1 s/co ratio (ref 0.0–0.9)

## 2020-05-22 ENCOUNTER — Other Ambulatory Visit: Payer: Self-pay | Admitting: Nurse Practitioner

## 2020-06-30 ENCOUNTER — Telehealth: Payer: Self-pay

## 2020-06-30 NOTE — Telephone Encounter (Signed)
Patient notified that his forms have been completed and faxed and I have also placed them up front in the bin for him to pick up on Monday per pt. Marijo Sanes

## 2020-07-28 ENCOUNTER — Telehealth (INDEPENDENT_AMBULATORY_CARE_PROVIDER_SITE_OTHER): Payer: 59 | Admitting: Nurse Practitioner

## 2020-07-28 ENCOUNTER — Other Ambulatory Visit: Payer: Self-pay

## 2020-07-28 ENCOUNTER — Telehealth: Payer: Self-pay

## 2020-07-28 DIAGNOSIS — U071 COVID-19: Secondary | ICD-10-CM | POA: Diagnosis not present

## 2020-07-28 DIAGNOSIS — G4709 Other insomnia: Secondary | ICD-10-CM | POA: Diagnosis not present

## 2020-07-28 HISTORY — DX: COVID-19: U07.1

## 2020-07-28 MED ORDER — TRAZODONE HCL 50 MG PO TABS: ORAL_TABLET | ORAL | 0 refills | Status: DC

## 2020-07-28 MED ORDER — VITAMIN D (ERGOCALCIFEROL) 1.25 MG (50000 UNIT) PO CAPS: 50000.0000 [IU] | ORAL_CAPSULE | ORAL | 0 refills | Status: DC

## 2020-07-28 NOTE — Progress Notes (Signed)
Virtual Visit via MyChart   This visit type was conducted due to national recommendations for restrictions regarding the COVID-19 Pandemic (e.g. social distancing) in an effort to limit this patient's exposure and mitigate transmission in our community.  Due to his co-morbid illnesses, this patient is at least at moderate risk for complications without adequate follow up.  This format is felt to be most appropriate for this patient at this time.  All issues noted in this document were discussed and addressed.  A limited physical exam was performed with this format.    This visit type was conducted due to national recommendations for restrictions regarding the COVID-19 Pandemic (e.g. social distancing) in an effort to limit this patient's exposure and mitigate transmission in our community.  Patients identity confirmed using two different identifiers.  This format is felt to be most appropriate for this patient at this time.  All issues noted in this document were discussed and addressed.  No physical exam was performed (except for noted visual exam findings with Video Visits).    Date:  08/04/2020   ID:  Garrett Jones, DOB March 01, 1976, MRN [id number]  Patient Location:  Home - spoke with Garrett Jones  Provider location:   Office    Chief Complaint:  Positive covid  History of Present Illness:    Garrett Jones is a 44 y.o. male who presents via video conferencing for a telehealth visit today.    The patient does not have symptoms concerning for COVID-19 infection (fever, chills, cough, or new shortness of breath).   Monday morning he woke up with a chills and fever, went to get tested. He is also having congestion. He does feel like his fever is improving.   Continues to have a sense of taste and smell. Intermittent coughing.  He did go to [facility] who azithromycin which he is not taking. He is taking Emergency.    He is unable to sleep he is not able to sleep more than 3 hours,  he will usually sleep 4-5 hours on a regular.  He will wake up without his alarm clock.  He is having difficulty with staying asleep.  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    He did received the covid vaccine.  He is out of work until October 1st.  He was at the [employer] in [place] this past weekend.      Past Medical History:  Diagnosis Date   Vitamin D deficiency    Past Surgical History:  Procedure Laterality Date   HERNIA REPAIR       No outpatient medications have been marked as taking for the 07/28/20 encounter (Video Visit) with [name], FNP.     Allergies:   Patient has no known allergies.   Social History   Tobacco Use   Smoking status: Never Smoker   Smokeless tobacco: Never Used  Vaping Use   Vaping Use: Never used  Substance Use Topics   Alcohol use: Yes    Alcohol/week: 3.0 standard drinks    Types: 3 Cans of beer per week    Comment: ocassionally   Drug use: Yes    Frequency: 7.0 times per week    Types: Marijuana    Comment: last used 10-28-19     Family Hx: The patient's family history includes Allergies in his mother; Heart disease in his father; Hypertension in his father; Prostate cancer in his maternal grandfather. There is no history  of Colon cancer, Esophageal cancer, Rectal cancer, or Stomach cancer.  ROS:   Please see the history of present illness.    Review of Systems  Constitutional: Positive for chills and fever. Negative for malaise/fatigue.  Respiratory: Positive for cough (intermittent). Negative for shortness of breath and wheezing.   Cardiovascular: Negative.   Musculoskeletal: Negative for myalgias (no longer having muscle aches).  Psychiatric/Behavioral: Negative.     All other systems reviewed and are negative.   Labs/Other Tests and Data Reviewed:    Recent Labs: 09/10/2019: ALT 16; BUN 15; Creatinine, Ser 1.29; Hemoglobin 14.5; Platelets 138; Potassium 3.9; Sodium 143; TSH 1.970    Recent Lipid Panel No results found for: CHOL, TRIG, HDL, CHOLHDL, LDLCALC, LDLDIRECT  Wt Readings from Last 3 Encounters:  04/25/20 202 lb 6.4 oz (91.8 kg)  01/19/20 199 lb 4.8 oz (90.4 kg)  10/29/19 193 lb (87.5 kg)     Exam:    Vital Signs:  There were no vitals taken for this visit.    Physical Exam Constitutional:      General: He is not in acute distress.    Appearance: Normal appearance.  HENT:     Nose:     Comments: He sounds like he has nasal congestion Pulmonary:     Effort: Pulmonary effort is normal. No respiratory distress.     Breath sounds: Normal breath sounds.  Neurological:     General: No focal deficit present.     Mental Status: He is alert and oriented to person, place, and time.     Cranial Nerves: No cranial nerve deficit.  Psychiatric:        Mood and Affect: Mood normal.        Behavior: Behavior normal.        Thought Content: Thought content normal.        Judgment: Judgment normal.     ASSESSMENT & PLAN:    1. COVID-19 virus infection  Overall doing well, encouraged to continue with staying well hydrated with fluids  Advised if has shortness of breath, chest pain or symptoms worsen to go to ER for further evaluation.   2. Other insomnia  Acute, may be related to covid - traZODone (DESYREL) 50 MG tablet; Take 1/2 tab - 1 tablet by mouth at bedtime as needed  Dispense: 30 tablet; Refill: 0  COVID-19 Education: The signs and symptoms of COVID-19 were discussed with the patient and how to seek care for testing (follow up with PCP or arrange E-visit).  The importance of social distancing was discussed today.  Patient Risk:   After full review of this patients clinical status, I feel that they are at least moderate risk at this time.  Time:   Today, I have spent  minutes/ seconds with the patient with telehealth technology discussing above diagnoses.     Medication Adjustments/Labs and Tests Ordered: Current medicines are reviewed  at length with the patient today.  Concerns regarding medicines are outlined above.   Tests Ordered: No orders of the defined types were placed in this encounter.   Medication Changes: Meds ordered this encounter  Medications   traZODone (DESYREL) 50 MG tablet    Sig: Take 1/2 tab - 1 tablet by mouth at bedtime as needed    Dispense:  30 tablet    Refill:  0   Vitamin D, Ergocalciferol, (DRISDOL) 1.25 MG (50000 UNIT) CAPS capsule    Sig: Take 1 capsule (50,000 Units total) by mouth every 7 (seven)  days.    Dispense:  12 capsule    Refill:  0    Disposition:  Follow up prn  Signed, [name], FNP

## 2020-07-28 NOTE — Telephone Encounter (Signed)
Patient consented to virtual appointment. YL,RMA 

## 2020-07-28 NOTE — Patient Instructions (Signed)
Take vitamin d, zinc, vitamin c and aspirin 81 mg daily.

## 2020-08-04 ENCOUNTER — Encounter: Payer: Self-pay | Admitting: Nurse Practitioner

## 2020-08-24 ENCOUNTER — Other Ambulatory Visit: Payer: Self-pay | Admitting: Nurse Practitioner

## 2020-08-24 DIAGNOSIS — G4709 Other insomnia: Secondary | ICD-10-CM

## 2020-08-24 NOTE — Telephone Encounter (Signed)
Please refill patient's prescription 

## 2020-10-17 ENCOUNTER — Other Ambulatory Visit: Payer: Self-pay | Admitting: Nurse Practitioner

## 2020-10-24 ENCOUNTER — Encounter: Payer: Self-pay | Admitting: Nurse Practitioner

## 2020-10-24 ENCOUNTER — Other Ambulatory Visit: Payer: Self-pay

## 2020-10-24 ENCOUNTER — Ambulatory Visit (INDEPENDENT_AMBULATORY_CARE_PROVIDER_SITE_OTHER): Payer: 59 | Admitting: Nurse Practitioner

## 2020-10-24 VITALS — BP 126/80 | HR 63 | Temp 98.2°F | Ht 76.0 in | Wt 206.4 lb

## 2020-10-24 DIAGNOSIS — R5383 Other fatigue: Secondary | ICD-10-CM

## 2020-10-24 DIAGNOSIS — E559 Vitamin D deficiency, unspecified: Secondary | ICD-10-CM

## 2020-10-24 DIAGNOSIS — Z0000 Encounter for general adult medical examination without abnormal findings: Secondary | ICD-10-CM | POA: Diagnosis not present

## 2020-10-24 DIAGNOSIS — Z125 Encounter for screening for malignant neoplasm of prostate: Secondary | ICD-10-CM

## 2020-10-24 DIAGNOSIS — Z8616 Personal history of COVID-19: Secondary | ICD-10-CM | POA: Diagnosis not present

## 2020-10-24 DIAGNOSIS — Z6825 Body mass index (BMI) 25.0-25.9, adult: Secondary | ICD-10-CM

## 2020-10-24 DIAGNOSIS — F4321 Adjustment disorder with depressed mood: Secondary | ICD-10-CM

## 2020-10-24 NOTE — Patient Instructions (Signed)

## 2020-10-24 NOTE — Progress Notes (Signed)
I,Yamilka [employer] as a Education administrator for [employer], FNP.,have documented all relevant documentation on the behalf of Minette Brine, FNP,as directed by  Minette Brine, FNP while in the presence of Minette Brine, [place]. This visit occurred during the SARS-CoV-2 public health emergency.  Safety protocols were in place, including screening questions prior to the visit, additional usage of staff PPE, and extensive cleaning of exam room while observing appropriate contact time as indicated for disinfecting solutions.  Subjective:     Patient ID: Garrett Jones , male    DOB: 10/25/1976 , 44 y.o.   MRN: [id number]   Chief Complaint  Patient presents with  . Annual Exam    HPI  Here for HM.  He had covid in October.  He will have cramps in his thigh.    Wt Readings from Last 3 Encounters: 10/24/20 : 206 lb 6.4 oz (93.6 kg) 04/25/20 : 202 lb 6.4 oz (91.8 kg) 01/19/20 : 199 lb 4.8 oz (90.4 kg)     Past Medical History:  Diagnosis Date  . Vitamin D deficiency      Family History  Problem Relation Age of Onset  . Allergies Mother   . Heart disease Father   . Hypertension Father   . Prostate cancer Maternal Grandfather   . Colon cancer Neg Hx   . Esophageal cancer Neg Hx   . Rectal cancer Neg Hx   . Stomach cancer Neg Hx      Current Outpatient Medications:  Marland Kitchen  Vitamin D, Ergocalciferol, (DRISDOL) 1.25 MG (50000 UNIT) CAPS capsule, TAKE 1 CAPSULE BY MOUTH EVERY 7 DAYS, Disp: 12 capsule, Rfl: 0   No Known Allergies   Men's preventive visit. Patient Health Questionnaire (PHQ-2) is  [place] Visit from 01/19/2020 in [facility]  PHQ-2 Total Score 0     Patient is on a Regular diet; he reports he only eats 2 times a day. He mostly drinks water. He has been trying to meditate a few times a week. He is not currently exercising.  Marital status: Significant Other. Relevant history for alcohol use is:  Social History   Substance and Sexual Activity   Alcohol Use Yes  . Alcohol/week: 3.0 standard drinks  . Types: 3 Cans of beer per week   Comment: ocassionally   Relevant history for tobacco use is:  Social History   Tobacco Use  Smoking Status Never Smoker  Smokeless Tobacco Never Used  .   Review of Systems  HENT: Negative.   Eyes: Negative.   Respiratory: Negative.   Cardiovascular: Negative.   Gastrointestinal: Negative.   Endocrine: Negative.   Genitourinary: Negative.   Musculoskeletal: Negative.   Allergic/Immunologic: Negative.   Neurological: Negative.   Hematological: Negative.   Psychiatric/Behavioral: Negative.      Today's Vitals   10/24/20 0939  BP: 126/80  Pulse: 63  Temp: 98.2 F (36.8 C)  TempSrc: Oral  Weight: 206 lb 6.4 oz (93.6 kg)  Height: _0  (1.93 m)  PainSc: 0-No pain   Body mass index is 25.12 kg/m.   Objective:  Physical Exam Vitals reviewed.  Constitutional:      Appearance: Normal appearance. He is obese.  HENT:     Head: Normocephalic and atraumatic.     Right Ear: Tympanic membrane, ear canal and external ear normal. There is no impacted cerumen.     Left Ear: Tympanic membrane, ear canal and external ear normal. There is no impacted cerumen.  Cardiovascular:  Rate and Rhythm: Normal rate and regular rhythm.     Pulses: Normal pulses.     Heart sounds: Normal heart sounds. No murmur heard.   Pulmonary:     Effort: Pulmonary effort is normal. No respiratory distress.     Breath sounds: Normal breath sounds. No wheezing.  Abdominal:     General: Abdomen is flat. Bowel sounds are normal. There is no distension.     Palpations: Abdomen is soft.  Genitourinary:    Prostate: Normal.     Rectum: Guaiac result negative.  Musculoskeletal:        General: Normal range of motion.     Cervical back: Normal range of motion and neck supple.  Skin:    General: Skin is warm.     Capillary Refill: Capillary refill takes less than 2 seconds.  Neurological:     General: No  focal deficit present.     Mental Status: He is alert and oriented to person, place, and time.     Cranial Nerves: No cranial nerve deficit.  Psychiatric:        Mood and Affect: Mood normal.        Behavior: Behavior normal.        Thought Content: Thought content normal.        Judgment: Judgment normal.          Assessment And Plan:    1. Encounter for general adult medical examination w/o abnormal findings . Behavior modifications discussed and diet history reviewed.   . Pt will continue to exercise regularly and modify diet with low GI, plant based foods and decrease intake of processed foods.  . Recommend intake of daily multivitamin, Vitamin D, and calcium.  . Recommend for preventive screenings, as well as recommend immunizations that include influenza, TDAP (up to date) - CMP14+EGFR - CBC no Diff  2. Encounter for prostate cancer screening - PSA  3. Vitamin D deficiency  Will check vitamin D level and supplement as needed.     Also encouraged to spend 15 minutes in the sun daily.  - Vitamin D (25 hydroxy)  4. Fatigue, unspecified type  Will send for a sleep study if normal will refer to psychiatry for further evaluation  Metabolic labs were normal previously will check again since has been a year - Ambulatory referral to Sleep Studies - TSH - Vitamin B12 - Testosterone, Total  5. History of COVID-19  6. Grief  Continues to have episodes of feeling down and having difficulty with focusing  He will bring in another FMLA form to update   7. BMI 25.0-25.9,adult  Just over normal, he is encouraged to increase physical activity and eat a healthy diet     Patient was given opportunity to ask questions. Patient verbalized understanding of the plan and was able to repeat key elements of the plan. All questions were answered to their satisfaction.    Teola Bradley, FNP, have reviewed all documentation for this visit. The documentation on 10/24/20 for the  exam, diagnosis, procedures, and orders are all accurate and complete.  THE PATIENT IS ENCOURAGED TO PRACTICE SOCIAL DISTANCING DUE TO THE COVID-19 PANDEMIC.

## 2020-10-25 ENCOUNTER — Other Ambulatory Visit: Payer: Self-pay

## 2020-10-25 DIAGNOSIS — R944 Abnormal results of kidney function studies: Secondary | ICD-10-CM

## 2020-10-25 LAB — LIPID PANEL
Chol/HDL Ratio: 2.5 ratio (ref 0.0–5.0)
Cholesterol, Total: 165 mg/dL (ref 100–199)
HDL: 66 mg/dL (ref >39)
LDL Chol Calc (NIH): 85 mg/dL (ref 0–99)
Triglycerides: 71 mg/dL (ref 0–149)
VLDL Cholesterol Cal: 14 mg/dL (ref 5–40)

## 2020-10-25 LAB — CBC
Hematocrit: 45.9 % (ref 37.5–51.0)
Hemoglobin: 15.5 g/dL (ref 13.0–17.7)
MCH: 28.9 pg (ref 26.6–33.0)
MCHC: 33.8 g/dL (ref 31.5–35.7)
MCV: 86 fL (ref 79–97)
Platelets: 149 10*3/uL — ABNORMAL LOW (ref 150–450)
RBC: 5.36 x10E6/uL (ref 4.14–5.80)
RDW: 12.8 % (ref 11.6–15.4)
WBC: 5.4 10*3/uL (ref 3.4–10.8)

## 2020-10-25 LAB — CMP14+EGFR
ALT: 11 IU/L (ref 0–44)
AST: 15 IU/L (ref 0–40)
Albumin/Globulin Ratio: 1.8 (ref 1.2–2.2)
Albumin: 4.5 g/dL (ref 4.0–5.0)
Alkaline Phosphatase: 78 IU/L (ref 44–121)
BUN/Creatinine Ratio: 12 (ref 9–20)
BUN: 16 mg/dL (ref 6–24)
Bilirubin Total: 0.9 mg/dL (ref 0.0–1.2)
CO2: 23 mmol/L (ref 20–29)
Calcium: 9.4 mg/dL (ref 8.7–10.2)
Chloride: 104 mmol/L (ref 96–106)
Creatinine, Ser: 1.31 mg/dL — ABNORMAL HIGH (ref 0.76–1.27)
GFR calc Af Amer: 75 mL/min/{1.73_m2} (ref >59)
GFR calc non Af Amer: 65 mL/min/{1.73_m2} (ref >59)
Globulin, Total: 2.5 g/dL (ref 1.5–4.5)
Glucose: 85 mg/dL (ref 65–99)
Potassium: 4.2 mmol/L (ref 3.5–5.2)
Sodium: 142 mmol/L (ref 134–144)
Total Protein: 7 g/dL (ref 6.0–8.5)

## 2020-10-25 LAB — TSH: TSH: 2.23 u[IU]/mL (ref 0.450–4.500)

## 2020-10-25 LAB — PSA: Prostate Specific Ag, Serum: 0.5 ng/mL (ref 0.0–4.0)

## 2020-10-25 LAB — TESTOSTERONE: Testosterone: 585 ng/dL (ref 264–916)

## 2020-10-25 LAB — VITAMIN B12: Vitamin B-12: 368 pg/mL (ref 232–1245)

## 2020-10-25 LAB — VITAMIN D 25 HYDROXY (VIT D DEFICIENCY, FRACTURES): Vit D, 25-Hydroxy: 31.1 ng/mL (ref 30.0–100.0)

## 2020-11-14 ENCOUNTER — Other Ambulatory Visit: Payer: Self-pay

## 2020-11-14 ENCOUNTER — Other Ambulatory Visit: Payer: 59

## 2020-11-14 DIAGNOSIS — R944 Abnormal results of kidney function studies: Secondary | ICD-10-CM

## 2020-11-14 LAB — BMP8+EGFR
BUN/Creatinine Ratio: 8 — ABNORMAL LOW (ref 9–20)
BUN: 10 mg/dL (ref 6–24)
CO2: 23 mmol/L (ref 20–29)
Calcium: 9.1 mg/dL (ref 8.7–10.2)
Chloride: 102 mmol/L (ref 96–106)
Creatinine, Ser: 1.19 mg/dL (ref 0.76–1.27)
GFR calc Af Amer: 85 mL/min/{1.73_m2} (ref >59)
GFR calc non Af Amer: 73 mL/min/{1.73_m2} (ref >59)
Glucose: 91 mg/dL (ref 65–99)
Potassium: 4 mmol/L (ref 3.5–5.2)
Sodium: 139 mmol/L (ref 134–144)

## 2020-11-29 ENCOUNTER — Ambulatory Visit (INDEPENDENT_AMBULATORY_CARE_PROVIDER_SITE_OTHER): Payer: 59 | Admitting: Neurology

## 2020-11-29 ENCOUNTER — Encounter: Payer: Self-pay | Admitting: Neurology

## 2020-11-29 VITALS — BP 149/93 | HR 70 | Ht 76.0 in | Wt 207.0 lb

## 2020-11-29 DIAGNOSIS — F4321 Adjustment disorder with depressed mood: Secondary | ICD-10-CM

## 2020-11-29 DIAGNOSIS — J301 Allergic rhinitis due to pollen: Secondary | ICD-10-CM | POA: Diagnosis not present

## 2020-11-29 DIAGNOSIS — R002 Palpitations: Secondary | ICD-10-CM

## 2020-11-29 DIAGNOSIS — F5104 Psychophysiologic insomnia: Secondary | ICD-10-CM | POA: Diagnosis not present

## 2020-11-29 NOTE — Progress Notes (Signed)
SLEEP MEDICINE CLINIC    Provider:  Melvyn Novasarmen  Annalisse Minkoff, MD  Primary Care Physician:  Arnette FeltsMoore, Janece, FNP [address] [place] [id number]     Referring Provider: Arnette FeltsMoore, Janece, Fnp [address] [place] [id number]          Chief Complaint according to patient   Patient presents with:    . New Patient (Initial Visit)     Patient presents today to discuss his sleep. Patient has never had a sleep study. Patient does not snore and falls asleep, but his mind is racing. Patient has trouble maintaining sleep since his father died of COVID in September 2020. Patient has tried trazodone but it did not like the side effects. Wakes up at 3 AM and doesn't want to .       HISTORY OF PRESENT ILLNESS:  Garrett Jones is a 45 y.o.  African American male patient who is seen on 11/29/2020 for an insomnia evaluation.   Chief concern according to patient :  Insomnia - Patient presents today to discuss his sleep. Patient has never had a sleep study. Patient does not snore and falls asleep, but his mind is racing. Patient has trouble maintaining sleep since his father died of COVID in September 2020. Patient has tried trazodone but it did not like the side effects. Wakes up at 3 AM and doesn't want to .      Garrett Jones is  a right--handed [place] male who  has a  medical history of Vitamin D deficiency, hay fever, rhinitis sinusitis, otitis media, dental braces. Inguinal Hernia and surgery, ear tubing, ear infection, GERD with belching, and now anxiety and depression. Waking up with palpitations.   His father died at age 46, unexpected, of COVID. His father's mother died at age 46 in February. Family medical /sleep history: No other family member on CPAP with OSA, mother and sister with insomnia.    Social history: Patient is working for [employer] and used to work night shifts- until 20 month ago when he changed to days. 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 Family status is single with one daughter - and one son. Pets are not present. Tobacco use- marihuana  ETOH use - 2-5 a week ,  Caffeine intake in form of Coffee( /) Soda( 1-2 week or less) Tea ( herbal ) or energy drinks. Regular exercise at home , 40 push ups. Hobbies : basketball.      Sleep habits are as follows: he returns from work at 3.30 - watches Tv and falls asleep until 8.30 PM- The patient's dinner time is between 5-7 PM. The patient goes to bed at 11-12  PM and continues to sleep for 3-4  hours, wakes for unknown reasons. Not bathroom breaks.   The preferred sleep position is sideways , with the support of 1 pillow. Dreams are reportedly rare but he often wakes up with palpitations. 5 AM is the usual rise time. The patient wakes up with an alarm.  He  reports not feeling refreshed or restored in AM, with symptoms such as dry mouth , rarely  morning headaches and residual fatigue.  Naps are takenfrequently, lasting from 1-3 hours and are more refreshing than nocturnal sleep.    Review of Systems: Out of a complete 14 system review, the patient complains of only the following symptoms, and all other reviewed systems are negative.:  Fatigue, fragmented sleep, acquired Insomnia -  palpitations.    How likely are you to doze in the following situations: 0 = not likely, 1 = slight chance, 2 = moderate chance, 3 = high chance   Sitting and Reading? Watching Television? Sitting inactive in a public place (theater or meeting)? As a passenger in a car for an hour without a break? Lying down in the afternoon when circumstances permit? Sitting and talking to someone? Sitting quietly after lunch without alcohol? In a car, while stopped for a few minutes in traffic?   Total =11/ 24 points   FSS endorsed at 37/ 63 points.   Social History   Socioeconomic History  . Marital status: Significant Other    Spouse name: Not on file  . Number of children: 2  . Years of education:  Not on file  . Highest education level: Not on file  Occupational History  . Occupation: Research scientist (physical sciences)   Tobacco Use  . Smoking status: Never Smoker  . Smokeless tobacco: Never Used  Vaping Use  . Vaping Use: Never used  Substance and Sexual Activity  . Alcohol use: Yes    Alcohol/week: 3.0 standard drinks    Types: 3 Cans of beer per week    Comment: ocassionally  . Drug use: Yes    Frequency: 7.0 times per week    Types: Marijuana    Comment: last used 10-28-19  . Sexual activity: Not on file  Other Topics Concern  . Not on file  Social History Narrative  . Not on file   Social Determinants of Health   Financial Resource Strain: Not on file  Food Insecurity: Not on file  Transportation Needs: Not on file  Physical Activity: Not on file  Stress: Not on file  Social Connections: Not on file    Family History  Problem Relation Age of Onset  . Allergies Mother   . Heart disease Father   . Hypertension Father   . Prostate cancer Maternal Grandfather   . Colon cancer Neg Hx   . Esophageal cancer Neg Hx   . Rectal cancer Neg Hx   . Stomach cancer Neg Hx     Past Medical History:  Diagnosis Date  . Vitamin D deficiency     Past Surgical History:  Procedure Laterality Date  . HERNIA REPAIR       Current Outpatient Medications on File Prior to Visit  Medication Sig Dispense Refill  . Vitamin D, Ergocalciferol, (DRISDOL) 1.25 MG (50000 UNIT) CAPS capsule TAKE 1 CAPSULE BY MOUTH EVERY 7 DAYS 12 capsule 0   No current facility-administered medications on file prior to visit.   Physical exam:  Today's Vitals   11/29/20 0956  BP: (!) 149/93  Pulse: 70  Weight: 207 lb (93.9 kg)  Height: 6\' 4"  (1.93 m)   Body mass index is 25.2 kg/m.   Wt Readings from Last 3 Encounters:  11/29/20 207 lb (93.9 kg)  10/24/20 206 lb 6.4 oz (93.6 kg)  04/25/20 202 lb 6.4 oz (91.8 kg)     Ht Readings from Last 3 Encounters:  11/29/20 6\' 4"  (1.93 m)  10/24/20 6\' 4"  (1.93  m)  04/25/20 6\' 4"  (1.93 m)      General: The patient is awake, alert and appears not in acute distress. The patient is well groomed. Head: Normocephalic, atraumatic. Neck is supple. Mallampati 2,  neck circumference:15 inches . Nasal airflow alternating patent.  sinus pressure reported. Rhinitis.  Retrognathia is mildly seen.  Dental status: crowded-  Cardiovascular:  Regular rate and cardiac rhythm by pulse,  without distended neck veins. Respiratory: Lungs are clear to auscultation.  Skin:  Without evidence of ankle edema, or rash. Trunk: The patient's posture is erect.   Neurologic exam : The patient is awake and alert, oriented to place and time.   Memory subjective described as intact.  Attention span & concentration ability appears normal.  Speech is fluent,  without  dysarthria, dysphonia or aphasia.  Mood and affect are appropriate.   Cranial nerves: no loss of smell or taste reported  Pupils are equal and briskly reactive to light. Funduscopic exam deferred.   Extraocular movements in vertical and horizontal planes were intact and without nystagmus. No Diplopia. Visual fields by finger perimetry are intact. Hearing was intact to soft voice and finger rubbing.    Facial sensation intact to fine touch.  Facial motor strength is symmetric and tongue and uvula move midline.  Neck ROM : rotation, tilt and flexion extension were normal for age and shoulder shrug was symmetrical.    Motor exam:  Symmetric bulk, tone and ROM.   Normal tone without cog wheeling, symmetric grip strength .   Sensory:  Fine touch, pinprick and vibration were tested  and  normal.  Proprioception tested in the upper extremities was normal.   Coordination: Rapid alternating movements in the fingers/hands were of normal speed.  The Finger-to-nose maneuver was intact without evidence of ataxia, dysmetria or tremor.   Gait and station: Patient could rise unassisted from a seated position, walked  without assistive device.  Stance is of normal width/ base and the patient turned with 3 steps.  Toe and heel walk were deferred.  Deep tendon reflexes: in the  upper and lower extremities are symmetric and intact.  Babinski response was deferred .       After spending a total time of 45 minutes face to face and additional time for physical and neurologic examination, review of laboratory studies,  personal review of imaging studies, reports and results of other testing and review of referral information / records as far as provided in visit, I have established the following assessments:  Early morning arousals with palpitations.  Grief reaction, unsure of apnea could play a role but has longstanding allergies to respiratory tract, sinus, nasal  Congestion.  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 This is a grief reaction .  2) PSG or HST to screen for apnea, he doesn't have RLS, he does have palpitations.     My Plan is to proceed with:  1) I like him to take trazodone - 25 mg to start as he is very skeptic.  2) CBT is recommended for anxiety related insomnia, especially chronic. 3) stop smoking marihuana.     I would like to thank  [name], Fnp [address] [place],  [place] [address] for allowing me to meet with and to take care of this pleasant patient.   In short, Garrett Jones is presenting with Insomnia .   I plan to follow up either personally or through our NP within 3-4 month.   CC: I will share my notes with PCP   Electronically signed by: Melvyn Novas, MD 11/29/2020 10:17 AM  [facility] Board certified by The [employer] of Sleep Medicine and Diplomate of the [employer] of Sleep Medicine. Board certified In Neurology through the ABPN, Fellow of the [employer] of Neurology. Wellsite geologist of [employer]  [facility].

## 2020-11-29 NOTE — Patient Instructions (Signed)
Complicated Grief Grief is a normal response to the death of someone close to you. Feelings of fear, anger, and guilt can affect almost everyone who loses a loved one. It is also common to have symptoms of depression while you are grieving. These include problems with sleep, loss of appetite, and lack of energy. They may last for weeks or months after a loss. Complicated grief is different from normal grief or depression. Normal grieving involves sadness and feelings of loss, but those feelings get better and heal over time. Complicated grief is a severe type of grief that lasts for a long time, usually for several months to a year or longer. It interferes with your ability to function normally. Complicated grief may require treatment from a mental health care provider. What are the causes? The cause of this condition is not known. It is not clear why some people continue to struggle with grief and others do not. What increases the risk? You are more likely to develop this condition if:  The death of your loved one was sudden or unexpected.  The death of your loved one was due to a violent event.  Your loved one died from suicide.  Your loved one was a child or a young person.  You were very close to your loved one, or you were dependent on him or her.  You have a history of depression or anxiety. What are the signs or symptoms? Symptoms of this condition include:  Feeling disbelief or having a lack of emotion (numbness).  Being unable to enjoy good memories of your loved one.  Needing to avoid anything or anyone that reminds you of your loved one.  Being unable to stop thinking about the death.  Feeling intense anger or guilt.  Feeling alone and hopeless.  Feeling that your life is meaningless and empty.  Losing the desire to move on with your life. How is this diagnosed? This condition may be diagnosed based on:  Your symptoms. Complicated grief will be diagnosed if you have  ongoing symptoms of grief for 6-12 months or longer.  The effect of symptoms on your life. You may be diagnosed with this condition if your symptoms are interfering with your ability to live your life. Your health care provider may recommend that you see a mental health care provider. Many symptoms of depression are similar to the symptoms of complicated grief. It is important to be evaluated for complicated grief along with other mental health conditions. How is this treated? This condition is most commonly treated with talk therapy. This therapy is offered by a mental health specialist (psychiatrist). During therapy:  You will learn healthy ways to cope with the loss of your loved one.  Your mental health care provider may recommend antidepressant medicines.   Follow these instructions at home: Lifestyle  Take care of yourself. ? Eat on a regular basis, and maintain a healthy diet. Eat plenty of fruits, vegetables, lean protein, and whole grains. ? Try to get some exercise each day. Aim for 30 minutes of exercise on most days of the week. ? Keep a consistent sleep schedule. Try to get 8 or more hours of sleep each night. ? Start doing the things that you used to enjoy.  Do not use drugs or alcohol to ease your symptoms.  Spend time with friends and loved ones.   General instructions  Take over-the-counter and prescription medicines only as told by your health care provider.  Consider joining a grief (  bereavement) support group to help you deal with your loss.  Keep all follow-up visits as told by your health care provider. This is important. Contact a health care provider if:  Your symptoms prevent you from functioning normally.  Your symptoms do not get better with treatment. Get help right away if:  You have serious thoughts about hurting yourself or someone else.  You have suicidal feelings. If you ever feel like you may hurt yourself or others, or have thoughts about  taking your own life, get help right away. You can go to your nearest emergency department or call:  Your local emergency services (911 in the [place]  A suicide crisis helpline, such as the National Suicide Prevention Lifeline at 825-750-0716. This is open 24 hours a day. Summary  Complicated grief is a severe type of grief that lasts for a long time. This grief is not likely to go away on its own. Get the help you need.  Some griefs are more difficult than others and can cause this condition. You may need a certain type of treatment to help you recover if the loss of your loved one was sudden, violent, or due to suicide.  You may feel guilty about moving on with your life. Getting help does not mean that you are forgetting your loved one. It means that you are taking care of yourself.  Complicated grief is best treated with talk therapy. Medicines may also be prescribed.  Seek the help you need, and find support that will help you recover. This information is not intended to replace advice given to you by your health care provider. Make sure you discuss any questions you have with your health care provider. Document Revised: 04/14/2020 Document Reviewed: 04/14/2020 Elsevier Patient Education  2021 [employer] Inc. Managing Loss, Adult People experience loss in many different ways throughout their lives. Events such as moving, changing jobs, and losing friends can create a sense of loss. The loss may be as serious as a major health change, divorce, death of a pet, or death of a loved one. All of these types of loss are likely to create a physical and emotional reaction known as grief. Grief is the result of a major change or an absence of something or someone that you count on. Grief is a normal reaction to loss. A variety of factors can affect your grieving experience, including:  The nature of your loss.  Your relationship to what or whom you lost.  Your understanding of grief and how to  manage it.  Your support system. How to manage lifestyle changes Keep to your normal routine as much as possible.  If you have trouble focusing or doing normal activities, it is acceptable to take some time away from your normal routine.  Spend time with friends and loved ones.  Eat a healthy diet, get plenty of sleep, and rest when you feel tired.   How to recognize changes  The way that you deal with your grief will affect your ability to function as you normally do. When grieving, you may experience these changes:  Numbness, shock, sadness, anxiety, anger, denial, and guilt.  Thoughts about death.  Unexpected crying.  A physical sensation of emptiness in your stomach.  Problems sleeping and eating.  Tiredness (fatigue).  Loss of interest in normal activities.  Dreaming about or imagining seeing the person who died.  A need to remember what or whom you lost.  Difficulty thinking about anything other than your loss  for a period of time.  Relief. If you have been expecting the loss for a while, you may feel a sense of relief when it happens. Follow these instructions at home: Activity Express your feelings in healthy ways, such as:  Talking with others about your loss. It may be helpful to find others who have had a similar loss, such as a support group.  Writing down your feelings in a journal.  Doing physical activities to release stress and emotional energy.  Doing creative activities like painting, sculpting, or playing or listening to music.  Practicing resilience. This is the ability to recover and adjust after facing challenges. Reading some resources that encourage resilience may help you to learn ways to practice those behaviors.   General instructions  Be patient with yourself and others. Allow the grieving process to happen, and remember that grieving takes time. ? It is likely that you may never feel completely done with some grief. You may find a way to  move on while still cherishing memories and feelings about your loss. ? Accepting your loss is a process. It can take months or longer to adjust.  Keep all follow-up visits as told by your health care provider. This is important. Where to find support To get support for managing loss:  Ask your health care provider for help and recommendations, such as grief counseling or therapy.  Think about joining a support group for people who are managing a loss. Where to find more information You can find more information about managing loss from:  American Society of Clinical Oncology: www.cancer.net  American Psychological Association: [employer] Contact a health care provider if:  Your grief is extreme and keeps getting worse.  You have ongoing grief that does not improve.  Your body shows symptoms of grief, such as illness.  You feel depressed, anxious, or lonely. Get help right away if:  You have thoughts about hurting yourself or others. If you ever feel like you may hurt yourself or others, or have thoughts about taking your own life, get help right away. You can go to your nearest emergency department or call:  Your local emergency services (911 in the [place]  A suicide crisis helpline, such as the National Suicide Prevention Lifeline at (205)325-0451. This is open 24 hours a day. Summary  Grief is the result of a major change or an absence of someone or something that you count on. Grief is a normal reaction to loss.  The depth of grief and the period of recovery depend on the type of loss and your ability to adjust to the change and process your feelings.  Processing grief requires patience and a willingness to accept your feelings and talk about your loss with people who are supportive.  It is important to find resources that work for you and to realize that people experience grief differently. There is not one grieving process that works for everyone in the same  way.  Be aware that when grief becomes extreme, it can lead to more severe issues like isolation, depression, anxiety, or suicidal thoughts. Talk with your health care provider if you have any of these issues. This information is not intended to replace advice given to you by your health care provider. Make sure you discuss any questions you have with your health care provider. Document Revised: 04/14/2020 Document Reviewed: 04/14/2020 Elsevier Patient Education  2021 [employer].

## 2020-12-01 ENCOUNTER — Encounter: Payer: Self-pay | Admitting: Nurse Practitioner

## 2020-12-01 ENCOUNTER — Telehealth: Payer: Self-pay

## 2020-12-01 NOTE — Telephone Encounter (Signed)
I called patient and notified him that his forms have been completed YL,RMA

## 2020-12-01 NOTE — Telephone Encounter (Signed)
Unable to LVM for pt to call me back to schedule sleep study. VM box is full.

## 2020-12-05 ENCOUNTER — Telehealth: Payer: Self-pay

## 2020-12-05 NOTE — Telephone Encounter (Signed)
Unable LVM for pt to call me back to schedule sleep study. VM box full

## 2021-01-13 ENCOUNTER — Other Ambulatory Visit: Payer: Self-pay | Admitting: Nurse Practitioner

## 2021-01-18 ENCOUNTER — Ambulatory Visit (INDEPENDENT_AMBULATORY_CARE_PROVIDER_SITE_OTHER): Payer: 59 | Admitting: Nurse Practitioner

## 2021-01-18 ENCOUNTER — Encounter: Payer: Self-pay | Admitting: Nurse Practitioner

## 2021-01-18 ENCOUNTER — Other Ambulatory Visit: Payer: Self-pay

## 2021-01-18 VITALS — BP 126/92 | HR 75 | Temp 98.1°F | Ht 76.0 in | Wt 207.0 lb

## 2021-01-18 DIAGNOSIS — M62830 Muscle spasm of back: Secondary | ICD-10-CM | POA: Diagnosis not present

## 2021-01-18 DIAGNOSIS — M546 Pain in thoracic spine: Secondary | ICD-10-CM

## 2021-01-18 MED ORDER — CYCLOBENZAPRINE HCL 10 MG PO TABS: 10.0000 mg | ORAL_TABLET | Freq: Three times a day (TID) | ORAL | 0 refills | Status: DC | PRN

## 2021-01-18 MED ORDER — NAPROXEN 500 MG PO TABS: ORAL_TABLET | ORAL | 2 refills | Status: DC

## 2021-01-18 NOTE — Patient Instructions (Signed)
Take naprosyn 2 times a day for 5 days. Heating pad as needed Take muscle relaxer 3 times a day as needed - do not drive or operate heavy machinery while taking

## 2021-01-18 NOTE — Progress Notes (Signed)
Tomasa Hose as a scribe for [name], FNP.,have documented all relevant documentation on the behalf of [name], FNP,as directed by  [name], FNP while in the presence of [name], FNP.  This visit occurred during the SARS-CoV-2 public health emergency.  Safety protocols were in place, including screening questions prior to the visit, additional usage of staff PPE, and extensive cleaning of exam room while observing appropriate contact time as indicated for disinfecting solutions.  Subjective:     Patient ID: Garrett Jones , male    DOB: 11-Nov-1975 , 45 y.o.   MRN: [id number]   Chief Complaint  Patient presents with  . Back Pain    Patient stated he pulled a muscle in his back at work.     HPI  Patient presents today for an evaluation on his back.  He went to lift up the back of the truck and had pain at the top of his left side of his back. When he takes a deep breath he feels the pain in the left chest and back.    Back Pain This is a new problem. The current episode started today. The problem occurs constantly (feels the heaviness). The problem is unchanged. The pain does not radiate. The pain is at a severity of 7/10. The pain is moderate. The symptoms are aggravated by twisting and bending (taking a deep breath). Pertinent negatives include no numbness, paresthesias, tingling or weakness. Risk factors include sedentary lifestyle. He has tried nothing for the symptoms.     Past Medical History:  Diagnosis Date  . Vitamin D deficiency      Family History  Problem Relation Age of Onset  . Allergies Mother   . Heart disease Father   . Hypertension Father   . Prostate cancer Maternal Grandfather   . Colon cancer Neg Hx   . Esophageal cancer Neg Hx   . Rectal cancer Neg Hx   . Stomach cancer Neg Hx      Current Outpatient Medications:  .  cyclobenzaprine (FLEXERIL) 10 MG tablet, Take 1 tablet (10 mg total) by mouth 3 (three) times daily as needed  for muscle spasms., Disp: 30 tablet, Rfl: 0 .  naproxen (NAPROSYN) 500 MG tablet, Take 2 times a day with a meal as needed, Disp: 30 tablet, Rfl: 2 .  Vitamin D, Ergocalciferol, (DRISDOL) 1.25 MG (50000 UNIT) CAPS capsule, TAKE 1 CAPSULE BY MOUTH EVERY 7 DAYS, Disp: 12 capsule, Rfl: 0   No Known Allergies   Review of Systems  Constitutional: Negative.   HENT: Negative.   Eyes: Negative.   Respiratory: Negative.   Cardiovascular: Negative.   Gastrointestinal: Negative.   Endocrine: Negative.   Genitourinary: Negative.   Musculoskeletal: Positive for back pain.  Skin: Negative.   Neurological: Negative.  Negative for tingling, weakness, numbness and paresthesias.  Hematological: Negative.   Psychiatric/Behavioral: Negative.      Today's Vitals   01/18/21 1040  BP: (!) 126/92  Pulse: 75  Temp: 98.1 F (36.7 C)  TempSrc: Oral  Weight: 207 lb (93.9 kg)  Height: 6\' 4"  (1.93 m)  PainSc: 6   PainLoc: Back   Body mass index is 25.2 kg/m.  Wt Readings from Last 3 Encounters:  01/18/21 207 lb (93.9 kg)  11/29/20 207 lb (93.9 kg)  10/24/20 206 lb 6.4 oz (93.6 kg)   Objective:  Physical Exam Constitutional:      General: He is not in acute distress.    Appearance: Normal appearance.  He is normal weight.  Cardiovascular:     Rate and Rhythm: Normal rate and regular rhythm.     Pulses: Normal pulses.     Heart sounds: Normal heart sounds.  Pulmonary:     Effort: Pulmonary effort is normal. No respiratory distress.     Breath sounds: Normal breath sounds. No wheezing.  Musculoskeletal:        General: Tenderness (left lower back tenderness) present. Normal range of motion.  Skin:    General: Skin is warm and dry.     Capillary Refill: Capillary refill takes less than 2 seconds.  Neurological:     General: No focal deficit present.     Mental Status: He is alert and oriented to person, place, and time.     Cranial Nerves: No cranial nerve deficit.  Psychiatric:         Mood and Affect: Mood normal.        Behavior: Behavior normal.        Thought Content: Thought content normal.        Judgment: Judgment normal.         Assessment And Plan:     1. Acute left-sided thoracic back pain  Occurred while at work, I have advised him to contact his occupational health for further treatment since injury occurred while working. I will provide him with a muscle relaxer as he has tenderness on palpation and guarded movement - cyclobenzaprine (FLEXERIL) 10 MG tablet; Take 1 tablet (10 mg total) by mouth 3 (three) times daily as needed for muscle spasms.  Dispense: 30 tablet; Refill: 0  2. Back spasm  Apply heat to area and follow up with Occupational health - cyclobenzaprine (FLEXERIL) 10 MG tablet; Take 1 tablet (10 mg total) by mouth 3 (three) times daily as needed for muscle spasms.  Dispense: 30 tablet; Refill: 0     Patient was given opportunity to ask questions. Patient verbalized understanding of the plan and was able to repeat key elements of the plan. All questions were answered to their satisfaction.  [name], FNP   I, [name], FNP, have reviewed all documentation for this visit. The documentation on 01/18/21 for the exam, diagnosis, procedures, and orders are all accurate and complete.   IF YOU HAVE BEEN REFERRED TO A SPECIALIST, IT MAY TAKE 1-2 WEEKS TO SCHEDULE/PROCESS THE REFERRAL. IF YOU HAVE NOT HEARD FROM US/SPECIALIST IN TWO WEEKS, PLEASE GIVE [place] A CALL AT [phone].   THE PATIENT IS ENCOURAGED TO PRACTICE SOCIAL DISTANCING DUE TO THE COVID-19 PANDEMIC.

## 2021-02-01 ENCOUNTER — Encounter: Payer: Self-pay | Admitting: Nurse Practitioner

## 2021-02-02 ENCOUNTER — Encounter: Payer: Self-pay | Admitting: Nurse Practitioner

## 2021-04-09 ENCOUNTER — Other Ambulatory Visit: Payer: Self-pay | Admitting: Nurse Practitioner

## 2021-04-10 ENCOUNTER — Other Ambulatory Visit: Payer: Self-pay | Admitting: Nurse Practitioner

## 2021-04-24 ENCOUNTER — Ambulatory Visit (INDEPENDENT_AMBULATORY_CARE_PROVIDER_SITE_OTHER): Payer: 59 | Admitting: Nurse Practitioner

## 2021-04-24 ENCOUNTER — Encounter: Payer: Self-pay | Admitting: Nurse Practitioner

## 2021-04-24 ENCOUNTER — Other Ambulatory Visit: Payer: Self-pay

## 2021-04-24 VITALS — BP 132/74 | HR 72 | Temp 98.3°F | Ht 76.0 in | Wt 208.4 lb

## 2021-04-24 DIAGNOSIS — R002 Palpitations: Secondary | ICD-10-CM | POA: Diagnosis not present

## 2021-04-24 DIAGNOSIS — E559 Vitamin D deficiency, unspecified: Secondary | ICD-10-CM | POA: Diagnosis not present

## 2021-04-24 DIAGNOSIS — R5383 Other fatigue: Secondary | ICD-10-CM | POA: Diagnosis not present

## 2021-04-24 DIAGNOSIS — R079 Chest pain, unspecified: Secondary | ICD-10-CM

## 2021-04-24 NOTE — Progress Notes (Signed)
I,Yamilka Roman [employer] as a Education administrator for [employer], FNP.,have documented all relevant documentation on the behalf of Minette Brine, FNP,as directed by  Minette Brine, FNP while in the presence of Minette Brine, [place].  This visit occurred during the SARS-CoV-2 public health emergency.  Safety protocols were in place, including screening questions prior to the visit, additional usage of staff PPE, and extensive cleaning of exam room while observing appropriate contact time as indicated for disinfecting solutions.  Subjective:     Patient ID: Garrett Jones , male    DOB: Sep 26, 1976 , 45 y.o.   MRN: [id number]   Chief Complaint  Patient presents with   Chest Pain    Patient stated he has been having some chest pains and palpitations he stated he noticed it after he received his covid shot     HPI  Here for his follow up vitamin d. He also reports having chest pain, will feel a little "twinge" around his heart. He also was having palpitations. He feels like this occurred since having his covid vaccine. His dad had a history of MI's after the age of 71. Denies drinking caffeine, energy drinks and reports staying well hydrated with water. He is wanting to work out with a Clinical research associate.      Past Medical History:  Diagnosis Date   Vitamin D deficiency      Family History  Problem Relation Age of Onset   Allergies Mother    Heart disease Father    Hypertension Father    Prostate cancer Maternal Grandfather    Colon cancer Neg Hx    Esophageal cancer Neg Hx    Rectal cancer Neg Hx    Stomach cancer Neg Hx      Current Outpatient Medications:    Vitamin D, Ergocalciferol, (DRISDOL) 1.25 MG (50000 UNIT) CAPS capsule, TAKE 1 CAPSULE BY MOUTH EVERY 7 DAYS, Disp: 12 capsule, Rfl: 0   No Known Allergies   Review of Systems  Constitutional: Negative.   Respiratory: Negative.  Negative for shortness of breath.   Cardiovascular:  Positive for chest pain (intermittent chest pain) and  palpitations.  Neurological:  Negative for dizziness and headaches.  Psychiatric/Behavioral: Negative.      Today's Vitals   04/24/21 0942  BP: 132/74  Pulse: 72  Temp: 98.3 F (36.8 C)  Weight: 208 lb 6.4 oz (94.5 kg)  Height: 6' 4" (1.93 m)   Body mass index is 25.37 kg/m.   Objective:  Physical Exam Constitutional:      Appearance: Normal appearance.  Cardiovascular:     Rate and Rhythm: Normal rate and regular rhythm.     Pulses: Normal pulses.     Heart sounds: Normal heart sounds. No murmur heard. Pulmonary:     Effort: Pulmonary effort is normal. No respiratory distress.     Breath sounds: Normal breath sounds.  Skin:    Capillary Refill: Capillary refill takes less than 2 seconds.  Neurological:     General: No focal deficit present.     Mental Status: He is alert and oriented to person, place, and time.     Cranial Nerves: No cranial nerve deficit.  Psychiatric:        Mood and Affect: Mood normal. Mood is not anxious.        Behavior: Behavior normal.        Thought Content: Thought content normal.        Judgment: Judgment normal.        Assessment  And Plan:     1. Chest pain, unspecified type EKG done with NSR HR 61 I received a call from his eye provider who was concerned about abnormal findings to his eye - EKG 12-Lead - Ambulatory referral to Cardiology  2. Palpitations Will check labs below and EKG done Encouraged to increase water intake Will refer to cardiology for further evaluation - EKG 12-Lead - TSH - CBC - BMP8+eGFR - Ambulatory referral to Cardiology  3. Vitamin D deficiency Will check vitamin D level and supplement as needed.    Also encouraged to spend 15 minutes in the sun daily.  - VITAMIN D 25 Hydroxy (Vit-D Deficiency, Fractures)  4. Fatigue, unspecified type Will check metabolic causes and refer to cardiology pending labs if normal - Vitamin B12   He had been taking vitamin B12 but has stopped.   Patient was given  opportunity to ask questions. Patient verbalized understanding of the plan and was able to repeat key elements of the plan. All questions were answered to their satisfaction.  Minette Brine, FNP   I, Minette Brine, FNP, have reviewed all documentation for this visit. The documentation on 04/19/21 for the exam, diagnosis, procedures, and orders are all accurate and complete.   IF YOU HAVE BEEN REFERRED TO A SPECIALIST, IT MAY TAKE 1-2 WEEKS TO SCHEDULE/PROCESS THE REFERRAL. IF YOU HAVE NOT HEARD FROM US/SPECIALIST IN TWO WEEKS, PLEASE GIVE [place] A CALL AT [phone] X [phone].   THE PATIENT IS ENCOURAGED TO PRACTICE SOCIAL DISTANCING DUE TO THE COVID-19 PANDEMIC.

## 2021-04-25 LAB — BMP8+EGFR
BUN/Creatinine Ratio: 11 (ref 9–20)
BUN: 14 mg/dL (ref 6–24)
CO2: 24 mmol/L (ref 20–29)
Calcium: 9.6 mg/dL (ref 8.7–10.2)
Chloride: 100 mmol/L (ref 96–106)
Creatinine, Ser: 1.29 mg/dL — ABNORMAL HIGH (ref 0.76–1.27)
Glucose: 93 mg/dL (ref 65–99)
Potassium: 4.5 mmol/L (ref 3.5–5.2)
Sodium: 135 mmol/L (ref 134–144)
eGFR: 69 mL/min/{1.73_m2} (ref >59)

## 2021-04-25 LAB — CBC
Hematocrit: 45.2 % (ref 37.5–51.0)
Hemoglobin: 15.5 g/dL (ref 13.0–17.7)
MCH: 28.7 pg (ref 26.6–33.0)
MCHC: 34.3 g/dL (ref 31.5–35.7)
MCV: 84 fL (ref 79–97)
Platelets: 154 10*3/uL (ref 150–450)
RBC: 5.4 x10E6/uL (ref 4.14–5.80)
RDW: 12.5 % (ref 11.6–15.4)
WBC: 4.8 10*3/uL (ref 3.4–10.8)

## 2021-04-25 LAB — VITAMIN B12: Vitamin B-12: 413 pg/mL (ref 232–1245)

## 2021-04-25 LAB — VITAMIN D 25 HYDROXY (VIT D DEFICIENCY, FRACTURES): Vit D, 25-Hydroxy: 50.3 ng/mL (ref 30.0–100.0)

## 2021-04-25 LAB — TSH: TSH: 2.25 u[IU]/mL (ref 0.450–4.500)

## 2021-04-28 ENCOUNTER — Other Ambulatory Visit: Payer: Self-pay

## 2021-06-05 ENCOUNTER — Other Ambulatory Visit: Payer: Self-pay

## 2021-06-05 ENCOUNTER — Ambulatory Visit (INDEPENDENT_AMBULATORY_CARE_PROVIDER_SITE_OTHER): Payer: 59 | Admitting: Cardiology

## 2021-06-05 ENCOUNTER — Ambulatory Visit: Payer: 59

## 2021-06-05 ENCOUNTER — Encounter: Payer: Self-pay | Admitting: Cardiology

## 2021-06-05 VITALS — BP 168/100 | HR 63 | Ht 76.0 in | Wt 212.0 lb

## 2021-06-05 DIAGNOSIS — R079 Chest pain, unspecified: Secondary | ICD-10-CM

## 2021-06-05 DIAGNOSIS — R002 Palpitations: Secondary | ICD-10-CM

## 2021-06-05 DIAGNOSIS — I10 Essential (primary) hypertension: Secondary | ICD-10-CM | POA: Insufficient documentation

## 2021-06-05 DIAGNOSIS — Z8249 Family history of ischemic heart disease and other diseases of the circulatory system: Secondary | ICD-10-CM | POA: Diagnosis not present

## 2021-06-05 HISTORY — DX: Chest pain, unspecified: R07.9

## 2021-06-05 MED ORDER — AMLODIPINE BESYLATE 5 MG PO TABS: 5.0000 mg | ORAL_TABLET | Freq: Every day | ORAL | 3 refills | Status: DC

## 2021-06-05 MED ORDER — NITROGLYCERIN 0.4 MG SL SUBL: 0.4000 mg | SUBLINGUAL_TABLET | SUBLINGUAL | 3 refills | Status: DC | PRN

## 2021-06-05 MED ORDER — METOPROLOL TARTRATE 50 MG PO TABS: 50.0000 mg | ORAL_TABLET | Freq: Once | ORAL | 0 refills | Status: DC

## 2021-06-05 NOTE — Progress Notes (Signed)
Cardiology Office Note:    Date:  06/05/2021   ID:  Garrett Jones, DOB 06/02/1976, MRN [id number]  PCP:  [name], FNP  Cardiologist:  Thomasene Ripple, DO  Electrophysiologist:  None   Referring MD: [name], FNP   " I am experiencing chest pain and palpitations"   History of Present Illness:    Garrett Jones is a 45 y.o. male with family history of premature coronary artery disease in his father with his first heart attack in his early 76s, is here today to establish cardiac care and to be evaluated for intermittent chest pain as well as palpitations.  The patient tells me that over the last few months he has had intermittent palpitations he described as an abrupt onset of fast heartbeat which lasts for minutes at a time and then resolved.  His biggest problem though that he is concerned about is a factor he is having midsternal intermittent chest discomfort.  He described it as a burning sensation which comes and goes.  He notes that it usually last for few seconds prior to resolution.  Nothing makes it better or worse.  Initially this started on exertion but now he feels it both on exertion and at rest.  He is concerned.  Past Medical History:  Diagnosis Date   Vitamin D deficiency     Past Surgical History:  Procedure Laterality Date   HERNIA REPAIR      Current Medications: Current Meds  Medication Sig   amLODipine (NORVASC) 5 MG tablet Take 1 tablet (5 mg total) by mouth daily.   metoprolol tartrate (LOPRESSOR) 50 MG tablet Take 1 tablet (50 mg total) by mouth once for 1 dose. Take 2 hours prior to CT   nitroGLYCERIN (NITROSTAT) 0.4 MG SL tablet Place 1 tablet (0.4 mg total) under the tongue every 5 (five) minutes as needed for chest pain (No Cialis within 72 hours of taking Nitroglycerine).     Allergies:   Patient has no known allergies.   Social History   Socioeconomic History   Marital status: Significant Other    Spouse name: Not on file   Number of children:  2   Years of education: Not on file   Highest education level: Not on file  Occupational History   Occupation: postal clerk   Tobacco Use   Smoking status: Never   Smokeless tobacco: Never  Vaping Use   Vaping Use: Never used  Substance and Sexual Activity   Alcohol use: Yes    Alcohol/week: 3.0 standard drinks    Types: 3 Cans of beer per week    Comment: ocassionally   Drug use: Yes    Frequency: 7.0 times per week    Types: Marijuana    Comment: last used 10-28-19   Sexual activity: Not on file  Other Topics Concern   Not on file  Social History Narrative   Not on file   Social Determinants of Health   Financial Resource Strain: Not on file  Food Insecurity: Not on file  Transportation Needs: Not on file  Physical Activity: Not on file  Stress: Not on file  Social Connections: Not on file     Family History: The patient's family history includes Allergies in his mother; Heart attack in his father; Heart disease in his father; Hypertension in his father; Prostate cancer in his maternal grandfather. There is no history of Colon cancer, Esophageal cancer, Rectal cancer, or Stomach cancer.  ROS:   Review of Systems  Constitution: Negative for decreased appetite, fever and weight gain.  HENT: Negative for congestion, ear discharge, hoarse voice and sore throat.   Eyes: Negative for discharge, redness, vision loss in right eye and visual halos.  Cardiovascular: Reports chest pain and palpitation.  Negative for  dyspnea on exertion, leg swelling, orthopnea.  Respiratory: Negative for cough, hemoptysis, shortness of breath and snoring.   Endocrine: Negative for heat intolerance and polyphagia.  Hematologic/Lymphatic: Negative for bleeding problem. Does not bruise/bleed easily.  Skin: Negative for flushing, nail changes, rash and suspicious lesions.  Musculoskeletal: Negative for arthritis, joint pain, muscle cramps, myalgias, neck pain and stiffness.  Gastrointestinal:  Negative for abdominal pain, bowel incontinence, diarrhea and excessive appetite.  Genitourinary: Negative for decreased libido, genital sores and incomplete emptying.  Neurological: Negative for brief paralysis, focal weakness, headaches and loss of balance.  Psychiatric/Behavioral: Negative for altered mental status, depression and suicidal ideas.  Allergic/Immunologic: Negative for HIV exposure and persistent infections.    EKGs/Labs/Other Studies Reviewed:    The following studies were reviewed today:   EKG:  The ekg ordered today demonstrates sinus rhythm, heart rate 63 bpm   Recent Labs: 10/24/2020: ALT 11 04/24/2021: BUN 14; Creatinine, Ser 1.29; Hemoglobin 15.5; Platelets 154; Potassium 4.5; Sodium 135; TSH 2.250  Recent Lipid Panel    Component Value Date/Time   CHOL 165 10/24/2020 1152   TRIG 71 10/24/2020 1152   HDL 66 10/24/2020 1152   CHOLHDL 2.5 10/24/2020 1152   LDLCALC 85 10/24/2020 1152    Physical Exam:    VS:  BP (!) 168/100 (BP Location: Right Arm, Cuff Size: Normal)   Pulse 63   Ht 6\' 4"  (1.93 m)   Wt 212 lb (96.2 kg)   SpO2 97%   BMI 25.81 kg/m     Wt Readings from Last 3 Encounters:  06/05/21 212 lb (96.2 kg)  04/24/21 208 lb 6.4 oz (94.5 kg)  01/18/21 207 lb (93.9 kg)     GEN: Well nourished, well developed in no acute distress HEENT: Normal NECK: No JVD; No carotid bruits LYMPHATICS: No lymphadenopathy CARDIAC: S1S2 noted,RRR, no murmurs, rubs, gallops RESPIRATORY:  Clear to auscultation without rales, wheezing or rhonchi  ABDOMEN: Soft, non-tender, non-distended, +bowel sounds, no guarding. EXTREMITIES: No edema, No cyanosis, no clubbing MUSCULOSKELETAL:  No deformity  SKIN: Warm and dry NEUROLOGIC:  Alert and oriented x 3, non-focal PSYCHIATRIC:  Normal affect, good insight  ASSESSMENT:    1. Chest pain, unspecified type   2. Primary hypertension   3. Family history of premature CAD   4. Palpitations    PLAN:     The symptoms  chest pain is concerning, this patient does have intermediate risk for coronary artery disease and at this time I would like to pursue an ischemic evaluation in this patient.  Shared decision a coronary CTA at this time is appropriate.  I have discussed with the patient about the testing.  The patient has no IV contrast allergy and is agreeable to proceed with this test.    Sublingual nitroglycerin prescription was sent, its protocol and 911 protocol explained and the patient vocalized understanding questions were answered to the patient's satisfaction.  I talked to the patient he uses Cialis recreationally I have asked the patient to not use Cialis for 72 hours when using nitroglycerin.  He expresses understanding and was able to report a teach back method.  I would like to rule out a cardiovascular etiology of this palpitation, therefore at this time  I would like to placed a zio patch for  7 days.  He is hypertensive in the office today blood pressure manually taken by me L started patient on amlodipine 5 mg daily.  He is very apprehensive about starting antihypertensive medication.  I educated the patient the importance of this.  He understands that worsening elevated blood pressure can lead to heart attack and strokes and he would like to try medications for now.   Once these testing have been performed amd reviewed further reccomendations will be made. For now, I do reccomend that the patient goes to the nearest ED if  symptoms recur.  The patient is in agreement with the above plan. The patient left the office in stable condition.  The patient will follow up in 3 months or sooner if needed   Medication Adjustments/Labs and Tests Ordered: Current medicines are reviewed at length with the patient today.  Concerns regarding medicines are outlined above.  Orders Placed This Encounter  Procedures   CT CORONARY MORPH W/CTA COR W/SCORE W/CA W/CM &/OR WO/CM   Basic metabolic panel   Magnesium    LONG TERM MONITOR (3-14 DAYS)   EKG 12-Lead    Meds ordered this encounter  Medications   amLODipine (NORVASC) 5 MG tablet    Sig: Take 1 tablet (5 mg total) by mouth daily.    Dispense:  180 tablet    Refill:  3   nitroGLYCERIN (NITROSTAT) 0.4 MG SL tablet    Sig: Place 1 tablet (0.4 mg total) under the tongue every 5 (five) minutes as needed for chest pain (No Cialis within 72 hours of taking Nitroglycerine).    Dispense:  25 tablet    Refill:  3   metoprolol tartrate (LOPRESSOR) 50 MG tablet    Sig: Take 1 tablet (50 mg total) by mouth once for 1 dose. Take 2 hours prior to CT    Dispense:  1 tablet    Refill:  0     Patient Instructions  Medication Instructions:  Your physician has recommended you make the following change in your medication:  START AMLODIPINE 5MG  ONCE DAILY USE NITROGLYCERINE AS NEEDED FOR CHEST PAIN/DO NOT TAKE WITHIN 72 HOURS OF TAKING CIALIS  *If you need a refill on your cardiac medications before your next appointment, please call your pharmacy* Lab Your physician recommends that you return for lab work in: BMP and Magnesium 1 week prior to Cardiac CT   Testing/Procedures:Please arrive at the [facility] main entrance of [facility] at xx:xx AM (30-45 minutes prior to test start time)  [facility] [address] [place], [place] [phone]  Proceed to the [facility] Radiology Department (First Floor).  Please follow these instructions carefully (unless otherwise directed):  Hold all erectile dysfunction medications at least 48 hours prior to test.  On the Night Before the Test: Drink plenty of water. Do not consume any caffeinated/decaffeinated beverages or chocolate 12 hours prior to your test. Do not take any antihistamines 12 hours prior to your test.   On the Day of the Test: Drink plenty of water. Do not drink any water within one hour of the test. Do not eat any food 4 hours prior to the test. You  may take your regular medications prior to the test. IF NOT ON A BETA BLOCKER - Take 50 mg of lopressor (metoprolol) two hour before the test.   After the Test: Drink plenty of water. After receiving IV contrast,  you may experience a mild flushed feeling. This is normal. On occasion, you may experience a mild rash up to 24 hours after the test. This is not dangerous. If this occurs, you can take Benadryl 25 mg and increase your fluid intake. If you experience trouble breathing, this can be serious. If it is severe call 911 IMMEDIATELY. If it is mild, please call our office. If you take any of these medications: Glipizide/Metformin, Avandament, Glucavance, please do not take 48 hours after completing test.   ZIO XT- Long Term Monitor Instructions  Your physician has requested you wear a ZIO patch monitor for 14 days.  This is a single patch monitor. [employer] supplies one patch monitor per enrollment. Additional stickers are not available. Please do not apply patch if you will be having a Nuclear Stress Test,  Echocardiogram, Cardiac CT, MRI, or Chest Xray during the period you would be wearing the  monitor. The patch cannot be worn during these tests. You cannot remove and re-apply the  ZIO XT patch monitor.  Your ZIO patch monitor will be mailed 3 day USPS to your address on file. It may take 3-5 days  to receive your monitor after you have been enrolled.  Once you have received your monitor, please review the enclosed instructions. Your monitor  has already been registered assigning a specific monitor serial # to you.  Billing and Patient Assistance Program Information  We have supplied [employer] with any of your insurance information on file for billing purposes. [employer] offers a sliding scale Patient Assistance Program for patients that do not have  insurance, or whose insurance does not completely cover the cost of the ZIO monitor.  You must apply for the Patient Assistance Program to  qualify for this discounted rate.  To apply, please call [employer] at [phone], select option 4, select option 2, ask to apply for  Patient Assistance Program. M[employer]eth [employer] will ask your household income, and how many people  are in your household. They will quote your out-of-pocket cost based on that information.  [employer] will also be able to set up a 6668-month, interest-free payment plan if needed.  Applying the monitor   Shave hair from upper left chest.  Hold abrader disc by orange tab. Rub abrader in 40 strokes over the upper left chest as  indicated in your monitor instructions.  Clean area with 4 enclosed alcohol pads. Let dry.  Apply patch as indicated in monitor instructions. Patch will be placed under collarbone on left  side of chest with arrow pointing upward.  Rub patch adhesive wings for 2 minutes. Remove white label marked "1". Remove the white  label marked "2". Rub patch adhesive wings for 2 additional minutes.  While looking in a mirror, press and release button in center of patch. A small green light will  flash 3-4 times. This will be your only indicator that the monitor has been turned on.  Do not shower for the first 24 hours. You may shower after the first 24 hours.  Press the button if you feel a symptom. You will hear a small click. Record Date, Time and  Symptom in the Patient Logbook.  When you are ready to remove the patch, follow instructions on the last 2 pages of Patient  Logbook. Stick patch monitor onto the last page of Patient Logbook.  Place Patient Logbook in the blue and white box. Use locking tab on box and tape box closed  securely. The blue and white box has  prepaid postage on it. Please place it in the mailbox as  soon as possible. Your physician should have your test results approximately 7 days after the  monitor has been mailed back to [facility].  Call [facility] Customer Care at [phone] if you have questions regarding  your ZIO XT  patch monitor. Call them immediately if you see an orange light blinking on your  monitor.  If your monitor falls off in less than 4 days, contact our Monitor department at [phone].  If your monitor becomes loose or falls off after 4 days call [employer] at [phone] for  suggestions on securing your monitor   Follow-Up: At [facility], you and your health needs are our priority.  As part of our continuing mission to provide you with exceptional heart care, we have created designated Provider Care Teams.  These Care Teams include your primary Cardiologist (physician) and Advanced Practice Providers (APPs -  Physician Assistants and Nurse Practitioners) who all work together to provide you with the care you need, when you need it.  We recommend signing up for the patient portal called "MyChart".  Sign up information is provided on this After Visit Summary.  MyChart is used to connect with patients for Virtual Visits (Telemedicine).  Patients are able to view lab/test results, encounter notes, upcoming appointments, etc.  Non-urgent messages can be sent to your provider as well.   To learn more about what you can do with MyChart, go to [email or web address].    Your next appointment:   12 week(s)  The format for your next appointment:   In Person  Provider:   Dr. Ahmed Prima @ [facility]   Other Instructions Nitroglycerin sublingual tablets What is this medication? NITROGLYCERIN (nye troe GLI ser in) is a type of vasodilator. It relaxes blood vessels, increasing the blood and oxygen supply to your heart. This medicine is used to relieve chest pain caused by angina. It is also used to prevent chest pain before activities like climbing stairs, going outdoors in cold weather, orsexual activity. This medicine may be used for other purposes; ask your health care provider orpharmacist if you have questions. COMMON BRAND NAME(S): Nitroquick, Nitrostat, Nitrotab What should I tell my care  team before I take this medication? They need to know if you have any of these conditions: anemia head injury, recent stroke, or bleeding in the brain liver disease previous heart attack an unusual or allergic reaction to nitroglycerin, other medicines, foods, dyes, or preservatives pregnant or trying to get pregnant breast-feeding How should I use this medication? Take this medicine by mouth as needed. Use at the first sign of an angina attack (chest pain or tightness). You can also take this medicine 5 to 10 minutes before an event likely to produce chest pain. Follow the directions exactly as written on the prescription label. Place one tablet under your tongue and let it dissolve. Do not swallow whole. Replace the dose if you accidentally swallow it. It will help if your mouth is not dry. Saliva around the tablet will help it to dissolve more quickly. Do not eat or drink, smoke orchew tobacco while a tablet is dissolving. Sit down when taking this medicine. In an angina attack, you should feel better within 5 minutes after your first dose. You can take a dose every 5 minutes up to a total of 3 doses. If you do not feel better or feel worse after 1 dose, call 9-1-1 at once. Do not take more than 3  doses in 15 minutes. Your health care provider might give you other directions. Follow those directions if he or she does. Do not take yourmedicine more often than directed. Talk to your health care provider about the use of this medicine in children.Special care may be needed. Overdosage: If you think you have taken too much of this medicine contact apoison control center or emergency room at once. NOTE: This medicine is only for you. Do not share this medicine with others. What if I miss a dose? This does not apply. This medicine is only used as needed. What may interact with this medication? Do not take this medicine with any of the following medications: certain migraine medicines like ergotamine  and dihydroergotamine (DHE) medicines used to treat erectile dysfunction like sildenafil, tadalafil, and vardenafil riociguat This medicine may also interact with the following medications: alteplase aspirin heparin medicines for high blood pressure medicines for mental depression other medicines used to treat angina phenothiazines like chlorpromazine, mesoridazine, prochlorperazine, thioridazine This list may not describe all possible interactions. Give your health care provider a list of all the medicines, herbs, non-prescription drugs, or dietary supplements you use. Also tell them if you smoke, drink alcohol, or use illegaldrugs. Some items may interact with your medicine. What should I watch for while using this medication? Tell your doctor or health care professional if you feel your medicine is nolonger working. Keep this medicine with you at all times. Sit or lie down when you take your medicine to prevent falling if you feel dizzy or faint after using it. Try to remain calm. This will help you to feel better faster. If you feel dizzy, take several deep breaths and lie down with your feet propped up, or bend forwardwith your head resting between your knees. You may get drowsy or dizzy. Do not drive, use machinery, or do anything that needs mental alertness until you know how this drug affects you. Do not stand or sit up quickly, especially if you are an older patient. This reduces the risk of dizzy or fainting spells. Alcohol can make you more drowsy and dizzy.Avoid alcoholic drinks. Do not treat yourself for coughs, colds, or pain while you are taking this medicine without asking your doctor or health care professional for advice.Some ingredients may increase your blood pressure. What side effects may I notice from receiving this medication? Side effects that you should report to your doctor or health care professionalas soon as possible: allergic reactions (skin rash, itching or hives;  swelling of the face, lips, or tongue) low blood pressure (dizziness; feeling faint or lightheaded, falls; unusually weak or tired) low red blood cell counts (trouble breathing; feeling faint; lightheaded, falls; unusually weak or tired) Side effects that usually do not require medical attention (report to yourdoctor or health care professional if they continue or are bothersome): facial flushing (redness) headache nausea, vomiting This list may not describe all possible side effects. Call your doctor for medical advice about side effects. You may report side effects to FDA at1-800-FDA-1088. Where should I keep my medication? Keep out of the reach of children. Store at room temperature between 20 and 25 degrees C (68 and 77 degrees F). Store in Retail buyer. Protect from light and moisture. Keep tightlyclosed. Throw away any unused medicine after the expiration date. NOTE: This sheet is a summary. It may not cover all possible information. If you have questions about this medicine, talk to your doctor, pharmacist, orhealth care provider.  2022 Elsevier/Gold Standard (2018-07-23 16:46:32)  Adopting a Healthy Lifestyle.  Know what a healthy weight is for you (roughly BMI <25) and aim to maintain this   Aim for 7+ servings of fruits and vegetables daily   65-80+ fluid ounces of water or unsweet tea for healthy kidneys   Limit to max 1 drink of alcohol per day; avoid smoking/tobacco   Limit animal fats in diet for cholesterol and heart health - choose grass fed whenever available   Avoid highly processed foods, and foods high in saturated/trans fats   Aim for low stress - take time to unwind and care for your mental health   Aim for 150 min of moderate intensity exercise weekly for heart health, and weights twice weekly for bone health   Aim for 7-9 hours of sleep daily   When it comes to diets, agreement about the perfect plan isnt easy to find, even among the  experts. Experts at the [facility] of [employer] developed an idea known as the Healthy Eating Plate. Just imagine a plate divided into logical, healthy portions.   The emphasis is on diet quality:   Load up on vegetables and fruits - one-half of your plate: Aim for color and variety, and remember that potatoes dont count.   Go for whole grains - one-quarter of your plate: Whole wheat, barley, wheat berries, quinoa, oats, brown rice, and foods made with them. If you want pasta, go with whole wheat pasta.   Protein power - one-quarter of your plate: Fish, chicken, beans, and nuts are all healthy, versatile protein sources. Limit red meat.   The diet, however, does go beyond the plate, offering a few other suggestions.   Use healthy plant oils, such as olive, canola, soy, corn, sunflower and peanut. Check the labels, and avoid partially hydrogenated oil, which have unhealthy trans fats.   If youre thirsty, drink water. Coffee and tea are good in moderation, but skip sugary drinks and limit milk and dairy products to one or two daily servings.   The type of carbohydrate in the diet is more important than the amount. Some sources of carbohydrates, such as vegetables, fruits, whole grains, and beans-are healthier than others.   Finally, stay active  Signed, Thomasene Ripple, DO  06/05/2021 4:24 PM     [facility]

## 2021-06-05 NOTE — Patient Instructions (Addendum)
Medication Instructions:  Your physician has recommended you make the following change in your medication:  START AMLODIPINE 5MG  ONCE DAILY USE NITROGLYCERINE AS NEEDED FOR CHEST PAIN/DO NOT TAKE WITHIN 72 HOURS OF TAKING CIALIS  *If you need a refill on your cardiac medications before your next appointment, please call your pharmacy* Lab Your physician recommends that you return for lab work in: BMP and Magnesium 1 week prior to Cardiac CT   Testing/Procedures:Please arrive at the [facility] main entrance of [facility] at xx:xx AM (30-45 minutes prior to test start time)  [facility] [address] [place], [place] [phone]  Proceed to the [facility] Radiology Department (First Floor).  Please follow these instructions carefully (unless otherwise directed):  Hold all erectile dysfunction medications at least 48 hours prior to test.  On the Night Before the Test: Drink plenty of water. Do not consume any caffeinated/decaffeinated beverages or chocolate 12 hours prior to your test. Do not take any antihistamines 12 hours prior to your test.   On the Day of the Test: Drink plenty of water. Do not drink any water within one hour of the test. Do not eat any food 4 hours prior to the test. You may take your regular medications prior to the test. IF NOT ON A BETA BLOCKER - Take 50 mg of lopressor (metoprolol) two hour before the test.   After the Test: Drink plenty of water. After receiving IV contrast, you may experience a mild flushed feeling. This is normal. On occasion, you may experience a mild rash up to 24 hours after the test. This is not dangerous. If this occurs, you can take Benadryl 25 mg and increase your fluid intake. If you experience trouble breathing, this can be serious. If it is severe call 911 IMMEDIATELY. If it is mild, please call our office. If you take any of these medications: Glipizide/Metformin, Avandament, Glucavance,  please do not take 48 hours after completing test.   ZIO XT- Long Term Monitor Instructions  Your physician has requested you wear a ZIO patch monitor for 14 days.  This is a single patch monitor. [employer] supplies one patch monitor per enrollment. Additional stickers are not available. Please do not apply patch if you will be having a Nuclear Stress Test,  Echocardiogram, Cardiac CT, MRI, or Chest Xray during the period you would be wearing the  monitor. The patch cannot be worn during these tests. You cannot remove and re-apply the  ZIO XT patch monitor.  Your ZIO patch monitor will be mailed 3 day USPS to your address on file. It may take 3-5 days  to receive your monitor after you have been enrolled.  Once you have received your monitor, please review the enclosed instructions. Your monitor  has already been registered assigning a specific monitor serial # to you.  Billing and Patient Assistance Program Information  We have supplied [employer] with any of your insurance information on file for billing purposes. [employer] offers a sliding scale Patient Assistance Program for patients that do not have  insurance, or whose insurance does not completely cover the cost of the ZIO monitor.  You must apply for the Patient Assistance Program to qualify for this discounted rate.  To apply, please call [employer] at [phone], select option 4, select option 2, ask to apply for  Patient Assistance Program. [phone] will ask your household income, and how many people  are in your household. They will quote your out-of-pocket  cost based on that information.  [employer] will also be able to set up a 10556-month, interest-free payment plan if needed.  Applying the monitor   Shave hair from upper left chest.  Hold abrader disc by orange tab. Rub abrader in 40 strokes over the upper left chest as  indicated in your monitor instructions.  Clean area with 4 enclosed alcohol pads. Let dry.  Apply patch as  indicated in monitor instructions. Patch will be placed under collarbone on left  side of chest with arrow pointing upward.  Rub patch adhesive wings for 2 minutes. Remove white label marked "1". Remove the white  label marked "2". Rub patch adhesive wings for 2 additional minutes.  While looking in a mirror, press and release button in center of patch. A small green light will  flash 3-4 times. This will be your only indicator that the monitor has been turned on.  Do not shower for the first 24 hours. You may shower after the first 24 hours.  Press the button if you feel a symptom. You will hear a small click. Record Date, Time and  Symptom in the Patient Logbook.  When you are ready to remove the patch, follow instructions on the last 2 pages of Patient  Logbook. Stick patch monitor onto the last page of Patient Logbook.  Place Patient Logbook in the blue and white box. Use locking tab on box and tape box closed  securely. The blue and white box has prepaid postage on it. Please place it in the mailbox as  soon as possible. Your physician should have your test results approximately 7 days after the  monitor has been mailed back to [facility].  Call [facility] Customer Care at [phone] if you have questions regarding  your ZIO XT patch monitor. Call them immediately if you see an orange light blinking on your  monitor.  If your monitor falls off in less than 4 days, contact our Monitor department at [phone].  If your monitor becomes loose or falls off after 4 days call [employer] at [phone] for  suggestions on securing your monitor    Follow-Up: At [facility], you and your health needs are our priority.  As part of our continuing mission to provide you with exceptional heart care, we have created designated Provider Care Teams.  These Care Teams include your primary Cardiologist (physician) and Advanced Practice Providers (APPs -  Physician Assistants and Nurse  Practitioners) who all work together to provide you with the care you need, when you need it.  We recommend signing up for the patient portal called "MyChart".  Sign up information is provided on this After Visit Summary.  MyChart is used to connect with patients for Virtual Visits (Telemedicine).  Patients are able to view lab/test results, encounter notes, upcoming appointments, etc.  Non-urgent messages can be sent to your provider as well.   To learn more about what you can do with MyChart, go to [email or web address].    Your next appointment:   12 week(s)  The format for your next appointment:   In Person  Provider:   Dr. Ahmed PrimaKardi Tobb @ [facility]   Other Instructions Nitroglycerin sublingual tablets What is this medication? NITROGLYCERIN (nye troe GLI ser in) is a type of vasodilator. It relaxes blood vessels, increasing the blood and oxygen supply to your heart. This medicine is used to relieve chest pain caused by angina. It is also used to prevent chest pain before activities like climbing stairs, going  outdoors in cold weather, orsexual activity. This medicine may be used for other purposes; ask your health care provider orpharmacist if you have questions. COMMON BRAND NAME(S): Nitroquick, Nitrostat, Nitrotab What should I tell my care team before I take this medication? They need to know if you have any of these conditions: anemia head injury, recent stroke, or bleeding in the brain liver disease previous heart attack an unusual or allergic reaction to nitroglycerin, other medicines, foods, dyes, or preservatives pregnant or trying to get pregnant breast-feeding How should I use this medication? Take this medicine by mouth as needed. Use at the first sign of an angina attack (chest pain or tightness). You can also take this medicine 5 to 10 minutes before an event likely to produce chest pain. Follow the directions exactly as written on the prescription label. Place one  tablet under your tongue and let it dissolve. Do not swallow whole. Replace the dose if you accidentally swallow it. It will help if your mouth is not dry. Saliva around the tablet will help it to dissolve more quickly. Do not eat or drink, smoke orchew tobacco while a tablet is dissolving. Sit down when taking this medicine. In an angina attack, you should feel better within 5 minutes after your first dose. You can take a dose every 5 minutes up to a total of 3 doses. If you do not feel better or feel worse after 1 dose, call 9-1-1 at once. Do not take more than 3 doses in 15 minutes. Your health care provider might give you other directions. Follow those directions if he or she does. Do not take yourmedicine more often than directed. Talk to your health care provider about the use of this medicine in children.Special care may be needed. Overdosage: If you think you have taken too much of this medicine contact apoison control center or emergency room at once. NOTE: This medicine is only for you. Do not share this medicine with others. What if I miss a dose? This does not apply. This medicine is only used as needed. What may interact with this medication? Do not take this medicine with any of the following medications: certain migraine medicines like ergotamine and dihydroergotamine (DHE) medicines used to treat erectile dysfunction like sildenafil, tadalafil, and vardenafil riociguat This medicine may also interact with the following medications: alteplase aspirin heparin medicines for high blood pressure medicines for mental depression other medicines used to treat angina phenothiazines like chlorpromazine, mesoridazine, prochlorperazine, thioridazine This list may not describe all possible interactions. Give your health care provider a list of all the medicines, herbs, non-prescription drugs, or dietary supplements you use. Also tell them if you smoke, drink alcohol, or use illegaldrugs. Some  items may interact with your medicine. What should I watch for while using this medication? Tell your doctor or health care professional if you feel your medicine is nolonger working. Keep this medicine with you at all times. Sit or lie down when you take your medicine to prevent falling if you feel dizzy or faint after using it. Try to remain calm. This will help you to feel better faster. If you feel dizzy, take several deep breaths and lie down with your feet propped up, or bend forwardwith your head resting between your knees. You may get drowsy or dizzy. Do not drive, use machinery, or do anything that needs mental alertness until you know how this drug affects you. Do not stand or sit up quickly, especially if you are an older  patient. This reduces the risk of dizzy or fainting spells. Alcohol can make you more drowsy and dizzy.Avoid alcoholic drinks. Do not treat yourself for coughs, colds, or pain while you are taking this medicine without asking your doctor or health care professional for advice.Some ingredients may increase your blood pressure. What side effects may I notice from receiving this medication? Side effects that you should report to your doctor or health care professionalas soon as possible: allergic reactions (skin rash, itching or hives; swelling of the face, lips, or tongue) low blood pressure (dizziness; feeling faint or lightheaded, falls; unusually weak or tired) low red blood cell counts (trouble breathing; feeling faint; lightheaded, falls; unusually weak or tired) Side effects that usually do not require medical attention (report to yourdoctor or health care professional if they continue or are bothersome): facial flushing (redness) headache nausea, vomiting This list may not describe all possible side effects. Call your doctor for medical advice about side effects. You may report side effects to FDA at1-800-FDA-1088. Where should I keep my medication? Keep out of the  reach of children. Store at room temperature between 20 and 25 degrees C (68 and 77 degrees F). Store in Retail buyer. Protect from light and moisture. Keep tightlyclosed. Throw away any unused medicine after the expiration date. NOTE: This sheet is a summary. It may not cover all possible information. If you have questions about this medicine, talk to your doctor, pharmacist, orhealth care provider.  2022 Elsevier/Gold Standard (2018-07-23 16:46:32)

## 2021-06-14 ENCOUNTER — Other Ambulatory Visit: Payer: Self-pay | Admitting: Cardiology

## 2021-06-15 ENCOUNTER — Ambulatory Visit (HOSPITAL_COMMUNITY): Payer: 59

## 2021-06-26 ENCOUNTER — Telehealth (HOSPITAL_COMMUNITY): Payer: Self-pay | Admitting: *Deleted

## 2021-06-26 NOTE — Telephone Encounter (Signed)
Calling patient to go over Cardiac CT instructions and to get blood work prior to cardiac CT. Patient requesting a call back the day before test after 3pm but verbalized understanding to get blood work and hold ED medications.  Larey Brick RN Navigator Cardiac Imaging [facility] [phone] office [phone] cell

## 2021-06-28 ENCOUNTER — Telehealth (HOSPITAL_COMMUNITY): Payer: Self-pay | Admitting: *Deleted

## 2021-06-28 LAB — BASIC METABOLIC PANEL
BUN/Creatinine Ratio: 10 (ref 9–20)
BUN: 12 mg/dL (ref 6–24)
CO2: 24 mmol/L (ref 20–29)
Calcium: 9.3 mg/dL (ref 8.7–10.2)
Chloride: 102 mmol/L (ref 96–106)
Creatinine, Ser: 1.17 mg/dL (ref 0.76–1.27)
Glucose: 87 mg/dL (ref 65–99)
Potassium: 4.5 mmol/L (ref 3.5–5.2)
Sodium: 139 mmol/L (ref 134–144)
eGFR: 78 mL/min/{1.73_m2} (ref >59)

## 2021-06-28 LAB — MAGNESIUM: Magnesium: 2.1 mg/dL (ref 1.6–2.3)

## 2021-06-28 NOTE — Telephone Encounter (Signed)
Reaching out to patient to offer assistance regarding upcoming cardiac imaging study; pt verbalizes understanding of appt date/time, parking situation and where to check in, pre-test NPO status and medications ordered, and verified current allergies; name and call back number provided for further questions should they arise  Leeanne Butters RN Navigator Cardiac Imaging [facility] [phone] office [phone] cell  Patient to take 50mg metoprolol tartrate two hours prior to cardiac CT.  

## 2021-06-29 ENCOUNTER — Other Ambulatory Visit: Payer: Self-pay

## 2021-06-29 ENCOUNTER — Ambulatory Visit (HOSPITAL_COMMUNITY)
Admission: RE | Admit: 2021-06-29 | Discharge: 2021-06-29 | Disposition: A | Discharge status: Home / Self Care | Payer: 59 | Source: Ambulatory Visit | Attending: Cardiology | Admitting: Cardiology

## 2021-06-29 DIAGNOSIS — R079 Chest pain, unspecified: Secondary | ICD-10-CM | POA: Diagnosis present

## 2021-06-29 DIAGNOSIS — Z006 Encounter for examination for normal comparison and control in clinical research program: Secondary | ICD-10-CM

## 2021-06-29 MED ORDER — IOHEXOL 350 MG/ML SOLN
95.0000 mL | Freq: Once | INTRAVENOUS | Status: AC | PRN
  Administered 2021-06-29: 95 mL via INTRAVENOUS

## 2021-06-29 MED ORDER — NITROGLYCERIN 0.4 MG SL SUBL
SUBLINGUAL_TABLET | SUBLINGUAL | Status: AC
  Administered 2021-06-29: 0.8 mg
  Filled 2021-06-29: qty 2

## 2021-06-29 NOTE — Research (Signed)
IDENTIFY Informed Consent                  Subject Name: Garrett Jones    Subject met inclusion and exclusion criteria.  The informed consent form, study requirements and expectations were reviewed with the subject and questions and concerns were addressed prior to the signing of the consent form.  The subject verbalized understanding of the trial requirements.  The subject agreed to participate in the IDENTIFY trial and signed the informed consent at 15:22PM on 06/29/21.  The informed consent was obtained prior to performance of any protocol-specific procedures for the subject.  A copy of the signed informed consent was given to the subject and a copy was placed in the subject's medical record.    Ledon Snare , Research Assistant

## 2021-06-30 ENCOUNTER — Telehealth: Payer: Self-pay | Admitting: Cardiology

## 2021-06-30 NOTE — Telephone Encounter (Signed)
Patient returning call for CT results. 

## 2021-09-12 ENCOUNTER — Ambulatory Visit: Payer: 59 | Admitting: Cardiology

## 2021-10-24 ENCOUNTER — Ambulatory Visit: Payer: 59 | Admitting: Nurse Practitioner

## 2021-10-26 ENCOUNTER — Encounter: Payer: 59 | Admitting: Nurse Practitioner

## 2021-11-07 ENCOUNTER — Ambulatory Visit (INDEPENDENT_AMBULATORY_CARE_PROVIDER_SITE_OTHER): Payer: 59 | Admitting: Cardiology

## 2021-11-07 ENCOUNTER — Other Ambulatory Visit: Payer: Self-pay

## 2021-11-07 ENCOUNTER — Encounter: Payer: Self-pay | Admitting: Cardiology

## 2021-11-07 VITALS — BP 142/100 | HR 72 | Ht 76.0 in | Wt 203.0 lb

## 2021-11-07 DIAGNOSIS — F419 Anxiety disorder, unspecified: Secondary | ICD-10-CM | POA: Diagnosis not present

## 2021-11-07 DIAGNOSIS — R011 Cardiac murmur, unspecified: Secondary | ICD-10-CM | POA: Diagnosis not present

## 2021-11-07 DIAGNOSIS — I10 Essential (primary) hypertension: Secondary | ICD-10-CM | POA: Diagnosis not present

## 2021-11-07 DIAGNOSIS — F32A Depression, unspecified: Secondary | ICD-10-CM | POA: Diagnosis not present

## 2021-11-07 MED ORDER — AMLODIPINE BESYLATE 5 MG PO TABS: 5.0000 mg | ORAL_TABLET | Freq: Every day | ORAL | 3 refills | Status: DC

## 2021-11-07 NOTE — Progress Notes (Signed)
Cardiology Office Note:    Date:  11/07/2021   ID:  Garrett Jones, DOB 1976/05/24, MRN [id number]  PCP:  Minette Brine, FNP  Cardiologist:  Berniece Salines, DO  Electrophysiologist:  None   Referring MD: Minette Brine, FNP   " My blood pressure is elevated"  History of Present Illness:    Garrett Jones is a 46 y.o. male with a hx of hypertension, vitamin D deficiency who I initially saw on June 05, 2021 at that time he presented because he was experiencing intermittent chest discomfort and palpitations.  At that time, given his risk factors for a family history of coronary artery disease the patient for coronary CTA.  For his palpitation I also recommended that he wear a ZIO monitor.  He was hypertensive in the office that day which was manually taken by me as started patient on amlodipine 5 mg daily.  He had gotten his coronary CTA which did not show any evidence of coronary artery disease, he did wear the monitor. Unfortunately he has not started any antihypertensive medication  Past Medical History:  Diagnosis Date   Vitamin D deficiency     Past Surgical History:  Procedure Laterality Date   HERNIA REPAIR      Current Medications: No outpatient medications have been marked as taking for the 11/07/21 encounter (Office Visit) with Berniece Salines, DO.     Allergies:   Patient has no known allergies.   Social History   Socioeconomic History   Marital status: Significant Other    Spouse name: Not on file   Number of children: 2   Years of education: Not on file   Highest education level: Not on file  Occupational History   Occupation: postal clerk   Tobacco Use   Smoking status: Never   Smokeless tobacco: Never  Vaping Use   Vaping Use: Never used  Substance and Sexual Activity   Alcohol use: Yes    Alcohol/week: 3.0 standard drinks    Types: 3 Cans of beer per week    Comment: ocassionally   Drug use: Yes    Frequency: 7.0 times per week    Types: Marijuana     Comment: last used 10-28-19   Sexual activity: Not on file  Other Topics Concern   Not on file  Social History Narrative   Not on file   Social Determinants of Health   Financial Resource Strain: Not on file  Food Insecurity: Not on file  Transportation Needs: Not on file  Physical Activity: Not on file  Stress: Not on file  Social Connections: Not on file     Family History: The patient's family history includes Allergies in his mother; Heart attack in his father; Heart disease in his father; Hypertension in his father; Prostate cancer in his maternal grandfather. There is no history of Colon cancer, Esophageal cancer, Rectal cancer, or Stomach cancer.  ROS:   Review of Systems  Constitution: Negative for decreased appetite, fever and weight gain.  HENT: Negative for congestion, ear discharge, hoarse voice and sore throat.   Eyes: Negative for discharge, redness, vision loss in right eye and visual halos.  Cardiovascular: Negative for chest pain, dyspnea on exertion, leg swelling, orthopnea and palpitations.  Respiratory: Negative for cough, hemoptysis, shortness of breath and snoring.   Endocrine: Negative for heat intolerance and polyphagia.  Hematologic/Lymphatic: Negative for bleeding problem. Does not bruise/bleed easily.  Skin: Negative for flushing, nail changes, rash and suspicious lesions.  Musculoskeletal: Negative for arthritis,  joint pain, muscle cramps, myalgias, neck pain and stiffness.  Gastrointestinal: Negative for abdominal pain, bowel incontinence, diarrhea and excessive appetite.  Genitourinary: Negative for decreased libido, genital sores and incomplete emptying.  Neurological: Negative for brief paralysis, focal weakness, headaches and loss of balance.  Psychiatric/Behavioral: Negative for altered mental status, depression and suicidal ideas.  Allergic/Immunologic: Negative for HIV exposure and persistent infections.    EKGs/Labs/Other Studies Reviewed:     The following studies were reviewed today:   EKG:  The ekg ordered today demonstrates sinus rhythm  Recent Labs: 04/24/2021: Hemoglobin 15.5; Platelets 154; TSH 2.250 06/27/2021: BUN 12; Creatinine, Ser 1.17; Magnesium 2.1; Potassium 4.5; Sodium 139  Recent Lipid Panel    Component Value Date/Time   CHOL 165 10/24/2020 1152   TRIG 71 10/24/2020 1152   HDL 66 10/24/2020 1152   CHOLHDL 2.5 10/24/2020 1152   LDLCALC 85 10/24/2020 1152    Physical Exam:    VS:  BP (!) 142/100 (BP Location: Left Arm, Patient Position: Sitting, Cuff Size: Normal)    Pulse 72    Ht 6\' 4"  (1.93 m)    Wt 203 lb (92.1 kg)    SpO2 96%    BMI 24.71 kg/m     Wt Readings from Last 3 Encounters:  11/07/21 203 lb (92.1 kg)  06/05/21 212 lb (96.2 kg)  04/24/21 208 lb 6.4 oz (94.5 kg)     GEN: Well nourished, well developed in no acute distress HEENT: Normal NECK: No JVD; No carotid bruits LYMPHATICS: No lymphadenopathy CARDIAC: S1S2 noted,RRR, 2/6 late systolic ejection murmurs, rubs, gallops RESPIRATORY:  Clear to auscultation without rales, wheezing or rhonchi  ABDOMEN: Soft, non-tender, non-distended, +bowel sounds, no guarding. EXTREMITIES: No edema, No cyanosis, no clubbing MUSCULOSKELETAL:  No deformity  SKIN: Warm and dry NEUROLOGIC:  Alert and oriented x 3, non-focal PSYCHIATRIC:  Normal affect, good insight  ASSESSMENT:    1. Hypertension, unspecified type   2. Anxiety   3. Depression, unspecified depression type   4. Murmur, cardiac    PLAN:     He is hypertensive in the office today.  Fortunately he did not start antihypertensive medications as advised at his last visit.  He says he was attempting to do this or naturally.  It appears that his blood pressure in the morning are significantly higher he tells me.  He notes that sometimes at times in the afternoon his systolic blood pressure usually within target.  I am concerned because this is his second visit with a high blood pressure  and especially with the blood pressure that he gets in the morning is still elevated he may be experiencing nocturnal hypertension.   We had a very lengthy conversation about hypertension as well as the complications that can be associated with hypertension including kidney failure, heart attacks as well as stroke.  He was thankful for the open conversation.  He is willing to try the amlodipine 5 mg daily.  He is got a mild systolic murmur 2 out of 6 which appears to be new we will get an echocardiogram to assess this.  Is also help me understand for any significant LV wall thickness and any diastolic dysfunction.  He is going through some life changes and thinks he may be depressed as well as interval anxiety I am going to refer the patient to our behavioral health partners.    The patient is in agreement with the above plan. The patient left the office in stable condition.  The  patient will follow up in 12 weeks due to medication change.   Medication Adjustments/Labs and Tests Ordered: Current medicines are reviewed at length with the patient today.  Concerns regarding medicines are outlined above.  Orders Placed This Encounter  Procedures   Ambulatory referral to Psychology   ECHOCARDIOGRAM COMPLETE   Meds ordered this encounter  Medications   amLODipine (NORVASC) 5 MG tablet    Sig: Take 1 tablet (5 mg total) by mouth daily.    Dispense:  180 tablet    Refill:  3    Patient Instructions  Medication Instructions:  Your physician has recommended you make the following change in your medication:  START: Amlodipine 5 mg once daily *If you need a refill on your cardiac medications before your next appointment, please call your pharmacy*   Lab Work: None If you have labs (blood work) drawn today and your tests are completely normal, you will receive your results only by: MyChart Message (if you have MyChart) OR A paper copy in the mail If you have any lab test that is abnormal or  we need to change your treatment, we will call you to review the results.   Testing/Procedures: Your physician has requested that you have an echocardiogram. Echocardiography is a painless test that uses sound waves to create images of your heart. It provides your doctor with information about the size and shape of your heart and how well your hearts chambers and valves are working. This procedure takes approximately one hour. There are no restrictions for this procedure.   Follow-Up: At [facility], you and your health needs are our priority.  As part of our continuing mission to provide you with exceptional heart care, we have created designated Provider Care Teams.  These Care Teams include your primary Cardiologist (physician) and Advanced Practice Providers (APPs -  Physician Assistants and Nurse Practitioners) who all work together to provide you with the care you need, when you need it.  We recommend signing up for the patient portal called "MyChart".  Sign up information is provided on this After Visit Summary.  MyChart is used to connect with patients for Virtual Visits (Telemedicine).  Patients are able to view lab/test results, encounter notes, upcoming appointments, etc.  Non-urgent messages can be sent to your provider as well.   To learn more about what you can do with MyChart, go to [email or web address].    Your next appointment:   12 week(s)  The format for your next appointment:   In Person  Provider:   Thomasene Ripple, DO     Other Instructions     Adopting a Healthy Lifestyle.  Know what a healthy weight is for you (roughly BMI <25) and aim to maintain this   Aim for 7+ servings of fruits and vegetables daily   65-80+ fluid ounces of water or unsweet tea for healthy kidneys   Limit to max 1 drink of alcohol per day; avoid smoking/tobacco   Limit animal fats in diet for cholesterol and heart health - choose grass fed whenever available   Avoid highly  processed foods, and foods high in saturated/trans fats   Aim for low stress - take time to unwind and care for your mental health   Aim for 150 min of moderate intensity exercise weekly for heart health, and weights twice weekly for bone health   Aim for 7-9 hours of sleep daily   When it comes to diets, agreement about the perfect plan isnt easy to  find, even among the experts. Experts at the [place] developed an idea known as the Healthy Eating Plate. Just imagine a plate divided into logical, healthy portions.   The emphasis is on diet quality:   Load up on vegetables and fruits - one-half of your plate: Aim for color and variety, and remember that potatoes dont count.   Go for whole grains - one-quarter of your plate: Whole wheat, barley, wheat berries, quinoa, oats, brown rice, and foods made with them. If you want pasta, go with whole wheat pasta.   Protein power - one-quarter of your plate: Fish, chicken, beans, and nuts are all healthy, versatile protein sources. Limit red meat.   The diet, however, does go beyond the plate, offering a few other suggestions.   Use healthy plant oils, such as olive, canola, soy, corn, sunflower and peanut. Check the labels, and avoid partially hydrogenated oil, which have unhealthy trans fats.   If youre thirsty, drink water. Coffee and tea are good in moderation, but skip sugary drinks and limit milk and dairy products to one or two daily servings.   The type of carbohydrate in the diet is more important than the amount. Some sources of carbohydrates, such as vegetables, fruits, whole grains, and beans-are healthier than others.   Finally, stay active  Signed, Berniece Salines, DO  11/07/2021 6:31 PM    [facility]

## 2021-11-07 NOTE — Patient Instructions (Signed)
Medication Instructions:  Your physician has recommended you make the following change in your medication:  START: Amlodipine 5 mg once daily *If you need a refill on your cardiac medications before your next appointment, please call your pharmacy*   Lab Work: None If you have labs (blood work) drawn today and your tests are completely normal, you will receive your results only by: MyChart Message (if you have MyChart) OR A paper copy in the mail If you have any lab test that is abnormal or we need to change your treatment, we will call you to review the results.   Testing/Procedures: Your physician has requested that you have an echocardiogram. Echocardiography is a painless test that uses sound waves to create images of your heart. It provides your doctor with information about the size and shape of your heart and how well your hearts chambers and valves are working. This procedure takes approximately one hour. There are no restrictions for this procedure.   Follow-Up: At [facility], you and your health needs are our priority.  As part of our continuing mission to provide you with exceptional heart care, we have created designated Provider Care Teams.  These Care Teams include your primary Cardiologist (physician) and Advanced Practice Providers (APPs -  Physician Assistants and Nurse Practitioners) who all work together to provide you with the care you need, when you need it.  We recommend signing up for the patient portal called "MyChart".  Sign up information is provided on this After Visit Summary.  MyChart is used to connect with patients for Virtual Visits (Telemedicine).  Patients are able to view lab/test results, encounter notes, upcoming appointments, etc.  Non-urgent messages can be sent to your provider as well.   To learn more about what you can do with MyChart, go to [email or web address].    Your next appointment:   12 week(s)  The format for your next appointment:    In Person  Provider:   Thomasene Ripple, DO     Other Instructions

## 2021-11-07 NOTE — Progress Notes (Signed)
Cardiology Office Note:    Date:  11/07/2021   ID:  Garrett Jones, DOB Jun 01, 1976, MRN [id number]  PCP:  Minette Brine, FNP  Cardiologist:  Berniece Salines, DO  Electrophysiologist:  None   Referring MD: Minette Brine, FNP     History of Present Illness:    Garrett Jones is a 46 y.o. male with a hx of hypertension,  Past Medical History:  Diagnosis Date   Vitamin D deficiency     Past Surgical History:  Procedure Laterality Date   HERNIA REPAIR      Current Medications: No outpatient medications have been marked as taking for the 11/07/21 encounter (Office Visit) with Berniece Salines, DO.     Allergies:   Patient has no known allergies.   Social History   Socioeconomic History   Marital status: Significant Other    Spouse name: Not on file   Number of children: 2   Years of education: Not on file   Highest education level: Not on file  Occupational History   Occupation: postal clerk   Tobacco Use   Smoking status: Never   Smokeless tobacco: Never  Vaping Use   Vaping Use: Never used  Substance and Sexual Activity   Alcohol use: Yes    Alcohol/week: 3.0 standard drinks    Types: 3 Cans of beer per week    Comment: ocassionally   Drug use: Yes    Frequency: 7.0 times per week    Types: Marijuana    Comment: last used 10-28-19   Sexual activity: Not on file  Other Topics Concern   Not on file  Social History Narrative   Not on file   Social Determinants of Health   Financial Resource Strain: Not on file  Food Insecurity: Not on file  Transportation Needs: Not on file  Physical Activity: Not on file  Stress: Not on file  Social Connections: Not on file     Family History: The patient's family history includes Allergies in his mother; Heart attack in his father; Heart disease in his father; Hypertension in his father; Prostate cancer in his maternal grandfather. There is no history of Colon cancer, Esophageal cancer, Rectal cancer, or Stomach cancer.  ROS:    Review of Systems  Constitution: Negative for decreased appetite, fever and weight gain.  HENT: Negative for congestion, ear discharge, hoarse voice and sore throat.   Eyes: Negative for discharge, redness, vision loss in right eye and visual halos.  Cardiovascular: Negative for chest pain, dyspnea on exertion, leg swelling, orthopnea and palpitations.  Respiratory: Negative for cough, hemoptysis, shortness of breath and snoring.   Endocrine: Negative for heat intolerance and polyphagia.  Hematologic/Lymphatic: Negative for bleeding problem. Does not bruise/bleed easily.  Skin: Negative for flushing, nail changes, rash and suspicious lesions.  Musculoskeletal: Negative for arthritis, joint pain, muscle cramps, myalgias, neck pain and stiffness.  Gastrointestinal: Negative for abdominal pain, bowel incontinence, diarrhea and excessive appetite.  Genitourinary: Negative for decreased libido, genital sores and incomplete emptying.  Neurological: Negative for brief paralysis, focal weakness, headaches and loss of balance.  Psychiatric/Behavioral: Negative for altered mental status, depression and suicidal ideas.  Allergic/Immunologic: Negative for HIV exposure and persistent infections.    EKGs/Labs/Other Studies Reviewed:    The following studies were reviewed today:   EKG:  The ekg ordered today demonstrates   Recent Labs: 04/24/2021: Hemoglobin 15.5; Platelets 154; TSH 2.250 06/27/2021: BUN 12; Creatinine, Ser 1.17; Magnesium 2.1; Potassium 4.5; Sodium 139  Recent Lipid Panel  Component Value Date/Time   CHOL 165 10/24/2020 1152   TRIG 71 10/24/2020 1152   HDL 66 10/24/2020 1152   CHOLHDL 2.5 10/24/2020 1152   LDLCALC 85 10/24/2020 1152    Physical Exam:    VS:  BP (!) 142/100 (BP Location: Left Arm, Patient Position: Sitting, Cuff Size: Normal)    Pulse 72    Ht 6\' 4"  (1.93 m)    Wt 203 lb (92.1 kg)    SpO2 96%    BMI 24.71 kg/m     Wt Readings from Last 3 Encounters:   11/07/21 203 lb (92.1 kg)  06/05/21 212 lb (96.2 kg)  04/24/21 208 lb 6.4 oz (94.5 kg)     GEN: Well nourished, well developed in no acute distress HEENT: Normal NECK: No JVD; No carotid bruits LYMPHATICS: No lymphadenopathy CARDIAC: S1S2 noted,RRR, no murmurs, rubs, gallops RESPIRATORY:  Clear to auscultation without rales, wheezing or rhonchi  ABDOMEN: Soft, non-tender, non-distended, +bowel sounds, no guarding. EXTREMITIES: No edema, No cyanosis, no clubbing MUSCULOSKELETAL:  No deformity  SKIN: Warm and dry NEUROLOGIC:  Alert and oriented x 3, non-focal PSYCHIATRIC:  Normal affect, good insight  ASSESSMENT:    No diagnosis found. PLAN:     1.  The patient is in agreement with the above plan. The patient left the office in stable condition.  The patient will follow up in   Medication Adjustments/Labs and Tests Ordered: Current medicines are reviewed at length with the patient today.  Concerns regarding medicines are outlined above.  No orders of the defined types were placed in this encounter.  No orders of the defined types were placed in this encounter.   There are no Patient Instructions on file for this visit.   Adopting a Healthy Lifestyle.  Know what a healthy weight is for you (roughly BMI <25) and aim to maintain this   Aim for 7+ servings of fruits and vegetables daily   65-80+ fluid ounces of water or unsweet tea for healthy kidneys   Limit to max 1 drink of alcohol per day; avoid smoking/tobacco   Limit animal fats in diet for cholesterol and heart health - choose grass fed whenever available   Avoid highly processed foods, and foods high in saturated/trans fats   Aim for low stress - take time to unwind and care for your mental health   Aim for 150 min of moderate intensity exercise weekly for heart health, and weights twice weekly for bone health   Aim for 7-9 hours of sleep daily   When it comes to diets, agreement about the perfect plan  isnt easy to find, even among the experts. Experts at the [place] developed an idea known as the Healthy Eating Plate. Just imagine a plate divided into logical, healthy portions.   The emphasis is on diet quality:   Load up on vegetables and fruits - one-half of your plate: Aim for color and variety, and remember that potatoes dont count.   Go for whole grains - one-quarter of your plate: Whole wheat, barley, wheat berries, quinoa, oats, brown rice, and foods made with them. If you want pasta, go with whole wheat pasta.   Protein power - one-quarter of your plate: Fish, chicken, beans, and nuts are all healthy, versatile protein sources. Limit red meat.   The diet, however, does go beyond the plate, offering a few other suggestions.   Use healthy plant oils, such as olive, canola, soy, corn, sunflower and  peanut. Check the labels, and avoid partially hydrogenated oil, which have unhealthy trans fats.   If youre thirsty, drink water. Coffee and tea are good in moderation, but skip sugary drinks and limit milk and dairy products to one or two daily servings.   The type of carbohydrate in the diet is more important than the amount. Some sources of carbohydrates, such as vegetables, fruits, whole grains, and beans-are healthier than others.   Finally, stay active  Signed, Berniece Salines, DO  11/07/2021 9:21 AM    [place]

## 2021-11-20 ENCOUNTER — Other Ambulatory Visit: Payer: Self-pay

## 2021-11-20 ENCOUNTER — Ambulatory Visit (HOSPITAL_COMMUNITY): Payer: 59 | Attending: Cardiology

## 2021-11-20 DIAGNOSIS — R011 Cardiac murmur, unspecified: Secondary | ICD-10-CM | POA: Insufficient documentation

## 2021-11-20 LAB — ECHOCARDIOGRAM COMPLETE
Area-P 1/2: 4.18 cm2
S' Lateral: 3.3 cm

## 2021-11-21 ENCOUNTER — Encounter: Payer: Self-pay | Admitting: Nurse Practitioner

## 2021-11-21 ENCOUNTER — Ambulatory Visit (INDEPENDENT_AMBULATORY_CARE_PROVIDER_SITE_OTHER): Payer: 59 | Admitting: Nurse Practitioner

## 2021-11-21 VITALS — BP 124/96 | HR 73 | Temp 98.1°F | Ht 76.0 in | Wt 200.6 lb

## 2021-11-21 DIAGNOSIS — I10 Essential (primary) hypertension: Secondary | ICD-10-CM

## 2021-11-21 DIAGNOSIS — Z0000 Encounter for general adult medical examination without abnormal findings: Secondary | ICD-10-CM

## 2021-11-21 DIAGNOSIS — E559 Vitamin D deficiency, unspecified: Secondary | ICD-10-CM

## 2021-11-21 DIAGNOSIS — Z79899 Other long term (current) drug therapy: Secondary | ICD-10-CM | POA: Diagnosis not present

## 2021-11-21 DIAGNOSIS — Z125 Encounter for screening for malignant neoplasm of prostate: Secondary | ICD-10-CM

## 2021-11-21 LAB — POCT URINALYSIS DIPSTICK
Bilirubin, UA: NEGATIVE
Blood, UA: NEGATIVE
Glucose, UA: NEGATIVE
Ketones, UA: NEGATIVE
Leukocytes, UA: NEGATIVE
Nitrite, UA: NEGATIVE
Protein, UA: POSITIVE — AB
Spec Grav, UA: 1.025 (ref 1.010–1.025)
Urobilinogen, UA: 0.2 E.U./dL
pH, UA: 6.5 (ref 5.0–8.0)

## 2021-11-21 LAB — POCT UA - MICROALBUMIN
Albumin/Creatinine Ratio, Urine, POC: 300
Creatinine, POC: 300 mg/dL
Microalbumin Ur, POC: 80 mg/L

## 2021-11-21 NOTE — Progress Notes (Signed)
I,Victoria T Hamilton,acting as a Education administrator for Minette Brine, FNP.,have documented all relevant documentation on the behalf of Minette Brine, FNP,as directed by  Minette Brine, FNP while in the presence of Minette Brine, [place].   This visit occurred during the SARS-CoV-2 public health emergency.  Safety protocols were in place, including screening questions prior to the visit, additional usage of staff PPE, and extensive cleaning of exam room while observing appropriate contact time as indicated for disinfecting solutions.  Subjective:     Patient ID: Garrett Jones , male    DOB: [birth year] , 46 y.o.   MRN: [id number]   Chief Complaint  Patient presents with   Annual Exam    HPI  Pt here for HM.  He reports taking BP med sporadically. He does not have an exact time when BP med is taken.  He also reports not taking BP med today. He is engaged and his son and fiance live in another area now, there have been some challenges with the relationship.  He has not started exercising due to wanting to see how blood pressure was.   Wt Readings from Last 3 Encounters: 11/21/21 : 200 lb 9.6 oz (91 kg) 11/07/21 : 203 lb (92.1 kg) 06/05/21 : 212 lb (96.2 kg)  When he closes his right eye he gets a white flash. Denies vision problems     Past Medical History:  Diagnosis Date   Vitamin D deficiency      Family History  Problem Relation Age of Onset   Allergies Mother    Heart disease Father    Hypertension Father    Heart attack Father        X 3 prior to death due to covid   Prostate cancer Maternal Grandfather    Colon cancer Neg Hx    Esophageal cancer Neg Hx    Rectal cancer Neg Hx    Stomach cancer Neg Hx      Current Outpatient Medications:    amLODipine (NORVASC) 5 MG tablet, Take 1 tablet (5 mg total) by mouth daily., Disp: 180 tablet, Rfl: 3   metoprolol tartrate (LOPRESSOR) 50 MG tablet, Take 1 tablet (50 mg total) by mouth once for 1 dose. Take 2 hours prior to CT (Patient not  taking: Reported on 11/07/2021), Disp: 1 tablet, Rfl: 0   nitroGLYCERIN (NITROSTAT) 0.4 MG SL tablet, Place 1 tablet (0.4 mg total) under the tongue every 5 (five) minutes as needed for chest pain (No Cialis within 72 hours of taking Nitroglycerine). (Patient not taking: Reported on 11/07/2021), Disp: 25 tablet, Rfl: 3   Vitamin D, Ergocalciferol, (DRISDOL) 1.25 MG (50000 UNIT) CAPS capsule, Take 1 capsule (50,000 Units total) by mouth every 7 (seven) days., Disp: 12 capsule, Rfl: 1   No Known Allergies   Men's preventive visit. Patient Health Questionnaire (PHQ-2) is  [place] Office Visit from 11/21/2021 in [facility]  PHQ-2 Total Score 1      Patient is on a regular diet; mostly healthy. Marital status: Significant Other. Relevant history for alcohol use is:  Social History   Substance and Sexual Activity  Alcohol Use Yes   Alcohol/week: 3.0 standard drinks   Types: 3 Cans of beer per week   Comment: ocassionally  . Relevant history for tobacco use is:  Social History   Tobacco Use  Smoking Status Never  Smokeless Tobacco Never  .   Review of Systems  Constitutional: Negative.   HENT: Negative.    Gastrointestinal:  Negative.   Endocrine: Negative.   Genitourinary: Negative.   Skin: Negative.   Allergic/Immunologic: Negative.   Neurological: Negative.     Today's Vitals   11/21/21 1434  BP: (!) 124/96  Pulse: 73  Temp: 98.1 F (36.7 C)  Weight: 200 lb 9.6 oz (91 kg)  Height: 6' 4"  (1.93 m)   Body mass index is 24.42 kg/m.  Wt Readings from Last 3 Encounters:  11/21/21 200 lb 9.6 oz (91 kg)  11/07/21 203 lb (92.1 kg)  06/05/21 212 lb (96.2 kg)    Objective:  Physical Exam Vitals reviewed.  Constitutional:      General: He is not in acute distress.    Appearance: Normal appearance.  HENT:     Head: Normocephalic and atraumatic.     Right Ear: Tympanic membrane, ear canal and external ear normal. There is no impacted cerumen.      Left Ear: Tympanic membrane, ear canal and external ear normal. There is no impacted cerumen.     Nose:     Comments: Deferred - masked    Mouth/Throat:     Comments: Deferred - masked Eyes:     Pupils: Pupils are equal, round, and reactive to light.  Cardiovascular:     Rate and Rhythm: Normal rate and regular rhythm.     Pulses: Normal pulses.     Heart sounds: Normal heart sounds. No murmur heard. Pulmonary:     Effort: Pulmonary effort is normal. No respiratory distress.     Breath sounds: Normal breath sounds.  Abdominal:     General: Abdomen is flat. Bowel sounds are normal. There is no distension.     Palpations: Abdomen is soft.     Tenderness: There is no abdominal tenderness.  Genitourinary:    Comments: Will check PSA level Musculoskeletal:        General: No swelling or tenderness. Normal range of motion.     Cervical back: Normal range of motion and neck supple.  Skin:    General: Skin is warm.     Capillary Refill: Capillary refill takes less than 2 seconds.  Neurological:     General: No focal deficit present.     Mental Status: He is alert and oriented to person, place, and time.     Cranial Nerves: No cranial nerve deficit.     Motor: No weakness.  Psychiatric:        Mood and Affect: Mood normal.        Behavior: Behavior normal.        Thought Content: Thought content normal.        Judgment: Judgment normal.        Assessment And Plan:    1. Encounter for general adult medical examination w/o abnormal findings Behavior modifications discussed and diet history reviewed.   Pt will continue to exercise regularly and modify diet with low GI, plant based foods and decrease intake of processed foods.  Recommend intake of daily multivitamin, Vitamin D, and calcium.  Recommend colonoscopy for preventive screenings, as well as recommend immunizations that include influenza, TDAP - Lipid panel  2. Encounter for prostate cancer screening - PSA  3. Primary  hypertension Comments: He has just started his blood pressure medications, continue to follow up with Cardiology - POCT Urinalysis Dipstick (81002) - POCT UA - Microalbumin - EKG 12-Lead - CMP14+EGFR  4. Vitamin D deficiency Will check vitamin D level and supplement as needed.    Also encouraged to spend 15  minutes in the sun daily.  - Vitamin D (25 hydroxy)  5. Other long term (current) drug therapy - CBC     Patient was given opportunity to ask questions. Patient verbalized understanding of the plan and was able to repeat key elements of the plan. All questions were answered to their satisfaction.   Minette Brine, FNP   I, Minette Brine, FNP, have reviewed all documentation for this visit. The documentation on 11/21/21 for the exam, diagnosis, procedures, and orders are all accurate and complete.   THE PATIENT IS ENCOURAGED TO PRACTICE SOCIAL DISTANCING DUE TO THE COVID-19 PANDEMIC.

## 2021-11-21 NOTE — Patient Instructions (Signed)

## 2021-11-22 ENCOUNTER — Encounter: Payer: Self-pay | Admitting: Nurse Practitioner

## 2021-11-22 LAB — CBC
Hematocrit: 47.8 % (ref 37.5–51.0)
Hemoglobin: 16 g/dL (ref 13.0–17.7)
MCH: 28 pg (ref 26.6–33.0)
MCHC: 33.5 g/dL (ref 31.5–35.7)
MCV: 84 fL (ref 79–97)
Platelets: 172 10*3/uL (ref 150–450)
RBC: 5.72 x10E6/uL (ref 4.14–5.80)
RDW: 13.1 % (ref 11.6–15.4)
WBC: 5.7 10*3/uL (ref 3.4–10.8)

## 2021-11-22 LAB — LIPID PANEL
Chol/HDL Ratio: 3.4 ratio (ref 0.0–5.0)
Cholesterol, Total: 189 mg/dL (ref 100–199)
HDL: 56 mg/dL (ref >39)
LDL Chol Calc (NIH): 107 mg/dL — ABNORMAL HIGH (ref 0–99)
Triglycerides: 151 mg/dL — ABNORMAL HIGH (ref 0–149)
VLDL Cholesterol Cal: 26 mg/dL (ref 5–40)

## 2021-11-22 LAB — CMP14+EGFR
ALT: 15 IU/L (ref 0–44)
AST: 19 IU/L (ref 0–40)
Albumin/Globulin Ratio: 1.8 (ref 1.2–2.2)
Albumin: 4.6 g/dL (ref 4.0–5.0)
Alkaline Phosphatase: 74 IU/L (ref 44–121)
BUN/Creatinine Ratio: 12 (ref 9–20)
BUN: 14 mg/dL (ref 6–24)
Bilirubin Total: 0.7 mg/dL (ref 0.0–1.2)
CO2: 26 mmol/L (ref 20–29)
Calcium: 9.4 mg/dL (ref 8.7–10.2)
Chloride: 102 mmol/L (ref 96–106)
Creatinine, Ser: 1.13 mg/dL (ref 0.76–1.27)
Globulin, Total: 2.5 g/dL (ref 1.5–4.5)
Glucose: 87 mg/dL (ref 70–99)
Potassium: 4.4 mmol/L (ref 3.5–5.2)
Sodium: 141 mmol/L (ref 134–144)
Total Protein: 7.1 g/dL (ref 6.0–8.5)
eGFR: 81 mL/min/{1.73_m2} (ref >59)

## 2021-11-22 LAB — VITAMIN D 25 HYDROXY (VIT D DEFICIENCY, FRACTURES): Vit D, 25-Hydroxy: 24.4 ng/mL — ABNORMAL LOW (ref 30.0–100.0)

## 2021-11-22 LAB — PSA: Prostate Specific Ag, Serum: 0.6 ng/mL (ref 0.0–4.0)

## 2021-11-23 ENCOUNTER — Other Ambulatory Visit: Payer: Self-pay | Admitting: Nurse Practitioner

## 2021-11-23 DIAGNOSIS — E559 Vitamin D deficiency, unspecified: Secondary | ICD-10-CM

## 2021-11-23 MED ORDER — VITAMIN D (ERGOCALCIFEROL) 1.25 MG (50000 UNIT) PO CAPS: 50000.0000 [IU] | ORAL_CAPSULE | ORAL | 1 refills | Status: DC

## 2021-12-24 ENCOUNTER — Encounter: Payer: Self-pay | Admitting: Nurse Practitioner

## 2021-12-28 ENCOUNTER — Encounter: Payer: Self-pay | Admitting: Nurse Practitioner

## 2022-01-03 ENCOUNTER — Telehealth: Payer: Self-pay

## 2022-01-03 NOTE — Telephone Encounter (Signed)
The pt was notified that his flma forms were completed and faxed, a copy has been kept for his records and the original is up front for pickup. ?

## 2022-01-30 ENCOUNTER — Encounter: Payer: Self-pay | Admitting: Cardiology

## 2022-01-30 ENCOUNTER — Other Ambulatory Visit: Payer: Self-pay

## 2022-01-30 ENCOUNTER — Encounter: Payer: Self-pay | Admitting: Nurse Practitioner

## 2022-01-30 ENCOUNTER — Ambulatory Visit (INDEPENDENT_AMBULATORY_CARE_PROVIDER_SITE_OTHER): Payer: 59 | Admitting: Cardiology

## 2022-01-30 VITALS — BP 122/82 | HR 112 | Ht 76.0 in | Wt 205.4 lb

## 2022-01-30 DIAGNOSIS — I10 Essential (primary) hypertension: Secondary | ICD-10-CM | POA: Diagnosis not present

## 2022-01-30 DIAGNOSIS — F32A Depression, unspecified: Secondary | ICD-10-CM | POA: Diagnosis not present

## 2022-01-30 MED ORDER — AMLODIPINE BESYLATE 5 MG PO TABS: 5.0000 mg | ORAL_TABLET | Freq: Every day | ORAL | 3 refills | Status: DC

## 2022-01-30 NOTE — Patient Instructions (Signed)
Medication Instructions:  ?Your physician has recommended you make the following change in your medication:  ?STOP: Nitroglycerin ?*If you need a refill on your cardiac medications before your next appointment, please call your pharmacy* ? ? ?Lab Work: ?None ?If you have labs (blood work) drawn today and your tests are completely normal, you will receive your results only by: ?MyChart Message (if you have MyChart) OR ?A paper copy in the mail ?If you have any lab test that is abnormal or we need to change your treatment, we will call you to review the results. ? ? ?Testing/Procedures: ?None ? ? ?Follow-Up: ?At [facility], you and your health needs are our priority.  As part of our continuing mission to provide you with exceptional heart care, we have created designated Provider Care Teams.  These Care Teams include your primary Cardiologist (physician) and Advanced Practice Providers (APPs -  Physician Assistants and Nurse Practitioners) who all work together to provide you with the care you need, when you need it. ? ?We recommend signing up for the patient portal called "MyChart".  Sign up information is provided on this After Visit Summary.  MyChart is used to connect with patients for Virtual Visits (Telemedicine).  Patients are able to view lab/test results, encounter notes, upcoming appointments, etc.  Non-urgent messages can be sent to your provider as well.   ?To learn more about what you can do with MyChart, go to [email or web address].   ? ?Your next appointment:   ?1 year(s) ? ?The format for your next appointment:   ?In Person ? ?Provider:   ?Thomasene Ripple, DO   ? ? ?Other Instructions ?  ?

## 2022-01-30 NOTE — Progress Notes (Signed)
?Cardiology Office Note:   ? ?Date:  01/30/2022  ? ?ID:  Garrett Jones, DOB [birth year], MRN [id number] ? ?PCP:  [name], FNP  ?Cardiologist:  Thomasene Ripple, DO  ?Electrophysiologist:  None  ? ?Referring MD: [name], FNP  ? ? ?" I am doing fine" ? ?History of Present Illness:   ? ?Garrett Jones is a 46 y.o. male with a hx of hypertension, vitamin D deficiency here today for follow-up visit.  I saw the patient on November 07, 2021 at that time he was hypertensive.  He had not started any of his antihypertensive medications.  During that visit I asked the patient to restart his amlodipine 5 mg daily.  He also had 2 out of 6 systolic murmur recommend echocardiogram.  He was able to get his echocardiogram done which showed evidence of mild LVH with no valvular abnormalities. ? ?He is here today for follow-up visit. ? ?Here for no complaints at this time. ? ?Past Medical History:  ?Diagnosis Date  ? Vitamin D deficiency   ? ? ?Past Surgical History:  ?Procedure Laterality Date  ? HERNIA REPAIR    ? ? ?Current Medications: ?Current Meds  ?Medication Sig  ? amLODipine (NORVASC) 5 MG tablet Take 1 tablet (5 mg total) by mouth daily.  ? Vitamin D, Ergocalciferol, (DRISDOL) 1.25 MG (50000 UNIT) CAPS capsule Take 1 capsule (50,000 Units total) by mouth every 7 (seven) days.  ?  ? ?Allergies:   Patient has no known allergies.  ? ?Social History  ? ?Socioeconomic History  ? Marital status: Significant Other  ?  Spouse name: Not on file  ? Number of children: 2  ? Years of education: Not on file  ? Highest education level: Not on file  ?Occupational History  ? Occupation: Research scientist (physical sciences)   ?Tobacco Use  ? Smoking status: Never  ? Smokeless tobacco: Never  ?Vaping Use  ? Vaping Use: Never used  ?Substance and Sexual Activity  ? Alcohol use: Yes  ?  Alcohol/week: 3.0 standard drinks  ?  Types: 3 Cans of beer per week  ?  Comment: ocassionally  ? Drug use: Yes  ?  Frequency: 7.0 times per week  ?  Types: Marijuana  ?  Comment: last  used 10-28-19  ? Sexual activity: Not on file  ?Other Topics Concern  ? Not on file  ?Social History Narrative  ? Not on file  ? ?Social Determinants of Health  ? ?Financial Resource Strain: Not on file  ?Food Insecurity: Not on file  ?Transportation Needs: Not on file  ?Physical Activity: Not on file  ?Stress: Not on file  ?Social Connections: Not on file  ?  ? ?Family History: ?The patient's family history includes Allergies in his mother; Heart attack in his father; Heart disease in his father; Hypertension in his father; Prostate cancer in his maternal grandfather. There is no history of Colon cancer, Esophageal cancer, Rectal cancer, or Stomach cancer. ? ?ROS:   ?Review of Systems  ?Constitution: Negative for decreased appetite, fever and weight gain.  ?HENT: Negative for congestion, ear discharge, hoarse voice and sore throat.   ?Eyes: Negative for discharge, redness, vision loss in right eye and visual halos.  ?Cardiovascular: Negative for chest pain, dyspnea on exertion, leg swelling, orthopnea and palpitations.  ?Respiratory: Negative for cough, hemoptysis, shortness of breath and snoring.   ?Endocrine: Negative for heat intolerance and polyphagia.  ?Hematologic/Lymphatic: Negative for bleeding problem. Does not bruise/bleed easily.  ?Skin: Negative for flushing, nail  changes, rash and suspicious lesions.  ?Musculoskeletal: Negative for arthritis, joint pain, muscle cramps, myalgias, neck pain and stiffness.  ?Gastrointestinal: Negative for abdominal pain, bowel incontinence, diarrhea and excessive appetite.  ?Genitourinary: Negative for decreased libido, genital sores and incomplete emptying.  ?Neurological: Negative for brief paralysis, focal weakness, headaches and loss of balance.  ?Psychiatric/Behavioral: Negative for altered mental status, depression and suicidal ideas.  ?Allergic/Immunologic: Negative for HIV exposure and persistent infections.  ? ? ?EKGs/Labs/Other Studies Reviewed:   ? ?The  following studies were reviewed today: ? ? ?EKG: None today ? ?Echocardiogram November 20, 2021 ?IMPRESSIONS  ? ? ? 1. Left ventricular ejection fraction, by estimation, is 60 to 65%. The  ?left ventricle has normal function. The left ventricle has no regional  ?wall motion abnormalities. There is mild left ventricular hypertrophy.  ?Left ventricular diastolic parameters  ?were normal. The average left ventricular global longitudinal strain is  ?-23.3 %.  ? 2. Right ventricular systolic function is normal. The right ventricular  ?size is normal.  ? 3. The mitral valve is normal in structure. Trivial mitral valve  ?regurgitation. No evidence of mitral stenosis.  ? 4. The aortic valve is tricuspid. Aortic valve regurgitation is not  ?visualized. No aortic stenosis is present.  ? ?FINDINGS  ? Left Ventricle: Left ventricular ejection fraction, by estimation, is 60  ?to 65%. The left ventricle has normal function. The left ventricle has no  ?regional wall motion abnormalities. The average left ventricular global  ?longitudinal strain is -23.3 %.  ?The left ventricular internal cavity size was normal in size. There is  ?mild left ventricular hypertrophy. Left ventricular diastolic parameters  ?were normal.  ? ?Right Ventricle: The right ventricular size is normal. No increase in  ?right ventricular wall thickness. Right ventricular systolic function is  ?normal.  ? ?Left Atrium: Left atrial size was normal in size.  ? ?Right Atrium: Right atrial size was normal in size.  ? ?Pericardium: There is no evidence of pericardial effusion.  ? ?Mitral Valve: The mitral valve is normal in structure. Trivial mitral  ?valve regurgitation. No evidence of mitral valve stenosis.  ? ?Tricuspid Valve: The tricuspid valve is normal in structure. Tricuspid  ?valve regurgitation is trivial.  ? ?Aortic Valve: The aortic valve is tricuspid. Aortic valve regurgitation is  ?not visualized. No aortic stenosis is present.  ? ?Pulmonic Valve: The  pulmonic valve was not well visualized. Pulmonic valve  ?regurgitation is not visualized.  ? ?Aorta: The aortic root and ascending aorta are structurally normal, with  ?no evidence of dilitation.  ? ?IAS/Shunts: The interatrial septum was not well visualized.  ? ?Recent Labs: ?04/24/2021: TSH 2.250 ?06/27/2021: Magnesium 2.1 ?11/21/2021: ALT 15; BUN 14; Creatinine, Ser 1.13; Hemoglobin 16.0; Platelets 172; Potassium 4.4; Sodium 141  ?Recent Lipid Panel ?   ?Component Value Date/Time  ? CHOL 189 11/21/2021 1635  ? TRIG 151 (H) 11/21/2021 1635  ? HDL 56 11/21/2021 1635  ? CHOLHDL 3.4 11/21/2021 1635  ? LDLCALC 107 (H) 11/21/2021 1635  ? ? ?Physical Exam:   ? ?VS:  BP 122/82   Pulse (!) 112   Ht 6\' 4"  (1.93 m)   Wt 205 lb 6.4 oz (93.2 kg)   SpO2 97%   BMI 25.00 kg/m?    ? ?Wt Readings from Last 3 Encounters:  ?01/30/22 205 lb 6.4 oz (93.2 kg)  ?11/21/21 200 lb 9.6 oz (91 kg)  ?11/07/21 203 lb (92.1 kg)  ?  ? ?GEN: Well nourished, well developed  in no acute distress ?HEENT: Normal ?NECK: No JVD; No carotid bruits ?LYMPHATICS: No lymphadenopathy ?CARDIAC: S1S2 noted,RRR, no murmurs, rubs, gallops ?RESPIRATORY:  Clear to auscultation without rales, wheezing or rhonchi  ?ABDOMEN: Soft, non-tender, non-distended, +bowel sounds, no guarding. ?EXTREMITIES: No edema, No cyanosis, no clubbing ?MUSCULOSKELETAL:  No deformity  ?SKIN: Warm and dry ?NEUROLOGIC:  Alert and oriented x 3, non-focal ?PSYCHIATRIC:  Normal affect, good insight ? ?ASSESSMENT:   ? ?1. Primary hypertension   ?2. Depression, unspecified depression type   ? ?PLAN:   ? ? ?His blood pressure deceptively in the office today.  This was taken by me.  We will continue patient his current dose of amlodipine.  We are going to stop the nitroglycerin.  His no need to proceed with this.  His coronary CTA does not show any evidence of CAD. ? ?He is interested in treatment for his erectile dysfunction with Viagra or Cialis.  He will discuss with his PCP for this.  I  have no objections. ? ?He still is interested in behavioral health therapy for treatment of depression. ? ?The patient is in agreement with the above plan. The patient left the office in stable condition.  The patie

## 2022-01-30 NOTE — Telephone Encounter (Signed)
He can call his cardiologist first to see if they will clear him then he would need an office visit to ensure there is not another cause ?

## 2022-02-12 ENCOUNTER — Encounter: Payer: Self-pay | Admitting: Nurse Practitioner

## 2022-02-12 ENCOUNTER — Ambulatory Visit (INDEPENDENT_AMBULATORY_CARE_PROVIDER_SITE_OTHER): Payer: 59 | Admitting: Nurse Practitioner

## 2022-02-12 VITALS — BP 128/68 | HR 65 | Temp 98.5°F | Ht 76.0 in | Wt 205.0 lb

## 2022-02-12 DIAGNOSIS — R6882 Decreased libido: Secondary | ICD-10-CM

## 2022-02-12 DIAGNOSIS — G4709 Other insomnia: Secondary | ICD-10-CM

## 2022-02-12 DIAGNOSIS — E782 Mixed hyperlipidemia: Secondary | ICD-10-CM | POA: Diagnosis not present

## 2022-02-12 DIAGNOSIS — E559 Vitamin D deficiency, unspecified: Secondary | ICD-10-CM | POA: Diagnosis not present

## 2022-02-12 DIAGNOSIS — I10 Essential (primary) hypertension: Secondary | ICD-10-CM

## 2022-02-12 MED ORDER — TADALAFIL 10 MG PO TABS: 10.0000 mg | ORAL_TABLET | ORAL | 1 refills | Status: DC | PRN

## 2022-02-12 NOTE — Patient Instructions (Addendum)
Hypertension, Adult ?High blood pressure (hypertension) is when the force of blood pumping through the arteries is too strong. The arteries are the blood vessels that carry blood from the heart throughout the body. Hypertension forces the heart to work harder to pump blood and may cause arteries to become narrow or stiff. Untreated or uncontrolled hypertension can cause a heart attack, heart failure, a stroke, kidney disease, and other problems. ?A blood pressure reading consists of a higher number over a lower number. Ideally, your blood pressure should be below 120/80. The first ("top") number is called the systolic pressure. It is a measure of the pressure in your arteries as your heart beats. The second ("bottom") number is called the diastolic pressure. It is a measure of the pressure in your arteries as the heart relaxes. ?What are the causes? ?The exact cause of this condition is not known. There are some conditions that result in or are related to high blood pressure. ?What increases the risk? ?Some risk factors for high blood pressure are under your control. The following factors may make you more likely to develop this condition: ?Smoking. ?Having type 2 diabetes mellitus, high cholesterol, or both. ?Not getting enough exercise or physical activity. ?Being overweight. ?Having too much fat, sugar, calories, or salt (sodium) in your diet. ?Drinking too much alcohol. ?Some risk factors for high blood pressure may be difficult or impossible to change. Some of these factors include: ?Having chronic kidney disease. ?Having a family history of high blood pressure. ?Age. Risk increases with age. ?Race. You may be at higher risk if you are African American. ?Gender. Men are at higher risk than women before age 57. After age 32, women are at higher risk than men. ?Having obstructive sleep apnea. ?Stress. ?What are the signs or symptoms? ?High blood pressure may not cause symptoms. Very high blood pressure  (hypertensive crisis) may cause: ?Headache. ?Anxiety. ?Shortness of breath. ?Nosebleed. ?Nausea and vomiting. ?Vision changes. ?Severe chest pain. ?Seizures. ?How is this diagnosed? ?This condition is diagnosed by measuring your blood pressure while you are seated, with your arm resting on a flat surface, your legs uncrossed, and your feet flat on the floor. The cuff of the blood pressure monitor will be placed directly against the skin of your upper arm at the level of your heart. It should be measured at least twice using the same arm. Certain conditions can cause a difference in blood pressure between your right and left arms. ?Certain factors can cause blood pressure readings to be lower or higher than normal for a short period of time: ?When your blood pressure is higher when you are in a health care provider's office than when you are at home, this is called white coat hypertension. Most people with this condition do not need medicines. ?When your blood pressure is higher at home than when you are in a health care provider's office, this is called masked hypertension. Most people with this condition may need medicines to control blood pressure. ?If you have a high blood pressure reading during one visit or you have normal blood pressure with other risk factors, you may be asked to: ?Return on a different day to have your blood pressure checked again. ?Monitor your blood pressure at home for 1 week or longer. ?If you are diagnosed with hypertension, you may have other blood or imaging tests to help your health care provider understand your overall risk for other conditions. ?How is this treated? ?This condition is treated by making  healthy lifestyle changes, such as eating healthy foods, exercising more, and reducing your alcohol intake. Your health care provider may prescribe medicine if lifestyle changes are not enough to get your blood pressure under control, and if: ?Your systolic blood pressure is above  130. ?Your diastolic blood pressure is above 80. ?Your personal target blood pressure may vary depending on your medical conditions, your age, and other factors. ?Follow these instructions at home: ?Eating and drinking ? ?Eat a diet that is high in fiber and potassium, and low in sodium, added sugar, and fat. An example eating plan is called the DASH (Dietary Approaches to Stop Hypertension) diet. To eat this way: ?Eat plenty of fresh fruits and vegetables. Try to fill one half of your plate at each meal with fruits and vegetables. ?Eat whole grains, such as whole-wheat pasta, brown rice, or whole-grain bread. Fill about one fourth of your plate with whole grains. ?Eat or drink low-fat dairy products, such as skim milk or low-fat yogurt. ?Avoid fatty cuts of meat, processed or cured meats, and poultry with skin. Fill about one fourth of your plate with lean proteins, such as fish, chicken without skin, beans, eggs, or tofu. ?Avoid pre-made and processed foods. These tend to be higher in sodium, added sugar, and fat. ?Reduce your daily sodium intake. Most people with hypertension should eat less than 1,500 mg of sodium a day. ?Do not drink alcohol if: ?Your health care provider tells you not to drink. ?You are pregnant, may be pregnant, or are planning to become pregnant. ?If you drink alcohol: ?Limit how much you use to: ?0-1 drink a day for women. ?0-2 drinks a day for men. ?Be aware of how much alcohol is in your drink. In the [place] one drink equals one 12 oz bottle of beer (355 mL), one 5 oz glass of wine (148 mL), or one 1? oz glass of hard liquor (44 mL). ?Lifestyle ? ?Work with your health care provider to maintain a healthy body weight or to lose weight. Ask what an ideal weight is for you. ?Get at least 30 minutes of exercise most days of the week. Activities may include walking, swimming, or biking. ?Include exercise to strengthen your muscles (resistance exercise), such as Pilates or lifting weights, as  part of your weekly exercise routine. Try to do these types of exercises for 30 minutes at least 3 days a week. ?Do not use any products that contain nicotine or tobacco, such as cigarettes, e-cigarettes, and chewing tobacco. If you need help quitting, ask your health care provider. ?Monitor your blood pressure at home as told by your health care provider. ?Keep all follow-up visits as told by your health care provider. This is important. ?Medicines ?Take over-the-counter and prescription medicines only as told by your health care provider. Follow directions carefully. Blood pressure medicines must be taken as prescribed. ?Do not skip doses of blood pressure medicine. Doing this puts you at risk for problems and can make the medicine less effective. ?Ask your health care provider about side effects or reactions to medicines that you should watch for. ?Contact a health care provider if you: ?Think you are having a reaction to a medicine you are taking. ?Have headaches that keep coming back (recurring). ?Feel dizzy. ?Have swelling in your ankles. ?Have trouble with your vision. ?Get help right away if you: ?Develop a severe headache or confusion. ?Have unusual weakness or numbness. ?Feel faint. ?Have severe pain in your chest or abdomen. ?Vomit repeatedly. ?Have trouble  breathing. ?Summary ?Hypertension is when the force of blood pumping through your arteries is too strong. If this condition is not controlled, it may put you at risk for serious complications. ?Your personal target blood pressure may vary depending on your medical conditions, your age, and other factors. For most people, a normal blood pressure is less than 120/80. ?Hypertension is treated with lifestyle changes, medicines, or a combination of both. Lifestyle changes include losing weight, eating a healthy, low-sodium diet, exercising more, and limiting alcohol. ?This information is not intended to replace advice given to you by your health care  provider. Make sure you discuss any questions you have with your health care provider. ?Document Revised: 07/02/2018 Document Reviewed: 07/02/2018 ?Elsevier Patient Education ? [place]. ? ? ?Conception Chancy -

## 2022-02-12 NOTE — Progress Notes (Signed)
?Clinical biochemist as a Neurosurgeon for [employer], FNP.,have documented all relevant documentation on the behalf of [name], FNP,as directed by  [name], FNP while in the presence of [name], FNP. ? ?This visit occurred during the SARS-CoV-2 public health emergency.  Safety protocols were in place, including screening questions prior to the visit, additional usage of staff PPE, and extensive cleaning of exam room while observing appropriate contact time as indicated for disinfecting solutions. ? ?Subjective:  ?  ? Patient ID: Garrett Jones , male    DOB: 21-Jun-1976 , 46 y.o.   MRN: [id number] ? ? ?Chief Complaint  ?Patient presents with  ? Hypertension  ? ? ?HPI ? ?Patient is here for htn follow up. He is taking his medications regularly. He is also taking an herbal supplement and one for his kidneys. MH blood pressure factors herbal supplement. He would like to discuss issue with low libido. 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  ? ?Wt Readings from Last 3 Encounters: ?02/12/22 : 205 lb (93 kg) ?01/30/22 : 205 lb 6.4 oz (93.2 kg) ?11/21/21 : 200 lb 9.6 oz (91 kg) ? ? ?  ? ?Past Medical History:  ?Diagnosis Date  ? Vitamin D deficiency   ?  ? ?Family History  ?Problem Relation Age of Onset  ? Allergies Mother   ? Heart disease Father   ? Hypertension Father   ? Heart attack Father   ?     X 3 prior to death due to covid  ? Prostate cancer Maternal Grandfather   ? Colon cancer Neg Hx   ? Esophageal cancer Neg Hx   ? Rectal cancer Neg Hx   ? Stomach cancer Neg Hx   ? ? ? ?Current Outpatient Medications:  ?  tadalafil (CIALIS) 10 MG tablet, Take 1 tablet (10 mg total) by mouth as needed for erectile dysfunction., Disp: 20 tablet, Rfl: 1 ?  amLODipine (NORVASC) 5 MG tablet, Take 1 tablet (5 mg total) by mouth daily., Disp: 180 tablet, Rfl: 3 ?  Vitamin D, Ergocalciferol, (DRISDOL) 1.25 MG (50000 UNIT) CAPS capsule, Take 1 capsule (50,000 Units total) by  mouth every 7 (seven) days., Disp: 12 capsule, Rfl: 1  ? ?No Known Allergies  ? ?Review of Systems  ?Constitutional: Negative.   ?Respiratory: Negative.    ?Cardiovascular: Negative.   ?Gastrointestinal: Negative.   ?Neurological: Negative.   ?Psychiatric/Behavioral: Negative.     ? ?Today's Vitals  ? 02/12/22 0925  ?BP: 128/68  ?Pulse: 65  ?Temp: 98.5 ?F (36.9 ?C)  ?TempSrc: Oral  ?Weight: 205 lb (93 kg)  ?Height: 6\' 4"  (1.93 m)  ? ?Body mass index is 24.95 kg/m?.  ?Wt Readings from Last 3 Encounters:  ?02/12/22 205 lb (93 kg)  ?01/30/22 205 lb 6.4 oz (93.2 kg)  ?11/21/21 200 lb 9.6 oz (91 kg)  ? ? ?Objective:  ?Physical Exam ?Vitals reviewed.  ?Constitutional:   ?   General: He is not in acute distress. ?   Appearance: Normal appearance.  ?Cardiovascular:  ?   Rate and Rhythm: Normal rate and regular rhythm.  ?   Pulses: Normal pulses.  ?   Heart sounds: Normal heart sounds. No murmur heard. ?Pulmonary:  ?   Effort: Pulmonary effort is normal. No respiratory distress.  ?   Breath sounds: Normal breath sounds. No wheezing.  ?Skin: ?   General: Skin is warm and dry.  ?   Capillary Refill: Capillary  refill takes less than 2 seconds.  ?Neurological:  ?   General: No focal deficit present.  ?   Mental Status: He is alert and oriented to person, place, and time.  ?   Cranial Nerves: No cranial nerve deficit.  ?   Motor: No weakness.  ?Psychiatric:     ?   Mood and Affect: Mood normal.     ?   Behavior: Behavior normal.     ?   Thought Content: Thought content normal.     ?   Judgment: Judgment normal.  ?  ? ?   ?Assessment And Plan:  ?   ?1. Primary hypertension ?Comments: Blood pressure is well controlled, continue current medications ? ?2. Other insomnia ?Comments: Doing well, no changes this visit ? ?3. Vitamin D deficiency ?Will check vitamin D level and supplement as needed.    ?Also encouraged to spend 15 minutes in the sun daily.  ?- VITAMIN D 25 Hydroxy (Vit-D Deficiency, Fractures) ? ?4. Mixed  hyperlipidemia ?Comments: Good control, continue with low fat diet ?- Lipid panel ? ?5. Low libido ?Comments: Will try Cialis, will also check testosterone level. ?- tadalafil (CIALIS) 10 MG tablet; Take 1 tablet (10 mg total) by mouth as needed for erectile dysfunction.  Dispense: 20 tablet; Refill: 1 ?- Testosterone, Total ?  ? ? ?Patient was given opportunity to ask questions. Patient verbalized understanding of the plan and was able to repeat key elements of the plan. All questions were answered to their satisfaction.  ?[name], FNP  ? ?I, [name], FNP, have reviewed all documentation for this visit. The documentation on 02/12/22 for the exam, diagnosis, procedures, and orders are all accurate and complete.  ? ?IF YOU HAVE BEEN REFERRED TO A SPECIALIST, IT MAY TAKE 1-2 WEEKS TO SCHEDULE/PROCESS THE REFERRAL. IF YOU HAVE NOT HEARD FROM US/SPECIALIST IN TWO WEEKS, PLEASE GIVE [place] A CALL AT [phone].  ? ?THE PATIENT IS ENCOURAGED TO PRACTICE SOCIAL DISTANCING DUE TO THE COVID-19 PANDEMIC.   ?

## 2022-02-13 LAB — LIPID PANEL
Chol/HDL Ratio: 3.1 ratio (ref 0.0–5.0)
Cholesterol, Total: 177 mg/dL (ref 100–199)
HDL: 58 mg/dL (ref >39)
LDL Chol Calc (NIH): 103 mg/dL — ABNORMAL HIGH (ref 0–99)
Triglycerides: 87 mg/dL (ref 0–149)
VLDL Cholesterol Cal: 16 mg/dL (ref 5–40)

## 2022-02-13 LAB — TESTOSTERONE: Testosterone: 468 ng/dL (ref 264–916)

## 2022-02-13 LAB — VITAMIN D 25 HYDROXY (VIT D DEFICIENCY, FRACTURES): Vit D, 25-Hydroxy: 46.4 ng/mL (ref 30.0–100.0)

## 2022-03-21 ENCOUNTER — Ambulatory Visit: Payer: 59 | Admitting: Nurse Practitioner

## 2022-06-12 ENCOUNTER — Encounter: Payer: Self-pay | Admitting: Nurse Practitioner

## 2022-06-12 ENCOUNTER — Ambulatory Visit (INDEPENDENT_AMBULATORY_CARE_PROVIDER_SITE_OTHER): Payer: 59 | Admitting: Nurse Practitioner

## 2022-06-12 VITALS — BP 130/88 | HR 62 | Temp 98.1°F | Ht 76.0 in | Wt 204.2 lb

## 2022-06-12 DIAGNOSIS — M545 Low back pain, unspecified: Secondary | ICD-10-CM | POA: Diagnosis not present

## 2022-06-12 DIAGNOSIS — I10 Essential (primary) hypertension: Secondary | ICD-10-CM

## 2022-06-12 NOTE — Progress Notes (Signed)
I,Victoria T Hamilton,acting as a Neurosurgeon for [name], FNP.,have documented all relevant documentation on the behalf of [name], FNP,as directed by  [name], FNP while in the presence of [name], FNP.    Subjective:     Patient ID: Garrett Jones , male    DOB: 10-08-1976 , 46 y.o.   MRN: [id number]   Chief Complaint  Patient presents with   Back Pain    HPI  Pt presents today for back pain, he was lifting heavy weights and may have pulled a muscle to his back. He reports being at the gym the other day and over exerting himself after going to the gym 3 days ago, [facility]  He admits stop taking amlodipine. He is eating better and his blood pressure has been lower.      Past Medical History:  Diagnosis Date   Vitamin D deficiency      Family History  Problem Relation Age of Onset   Allergies Mother    Heart disease Father    Hypertension Father    Heart attack Father        X 3 prior to death due to covid   Prostate cancer Maternal Grandfather    Colon cancer Neg Hx    Esophageal cancer Neg Hx    Rectal cancer Neg Hx    Stomach cancer Neg Hx      Current Outpatient Medications:    Vitamin D, Ergocalciferol, (DRISDOL) 1.25 MG (50000 UNIT) CAPS capsule, Take 1 capsule (50,000 Units total) by mouth every 7 (seven) days., Disp: 12 capsule, Rfl: 1   amLODipine (NORVASC) 5 MG tablet, Take 1 tablet (5 mg total) by mouth daily., Disp: 180 tablet, Rfl: 3   tadalafil (CIALIS) 10 MG tablet, Take 1 tablet (10 mg total) by mouth as needed for erectile dysfunction. (Patient not taking: Reported on 06/12/2022), Disp: 20 tablet, Rfl: 1   No Known Allergies   Review of Systems  Constitutional: Negative.   HENT: Negative.    Cardiovascular: Negative.   Endocrine: Negative.   Genitourinary: Negative.   Musculoskeletal:  Positive for back pain (low back pain).  Skin: Negative.   Neurological: Negative.   Hematological: Negative.       Today's Vitals   06/12/22 1614 06/12/22 1703  BP: (!) 160/100 130/88  Pulse: 62   Temp: 98.1 F (36.7 C)   SpO2: 98%   Weight: 204 lb 3.2 oz (92.6 kg)   Height: 6\' 4"  (1.93 m)   PainSc: 3     Body mass index is 24.86 kg/m.  Wt Readings from Last 3 Encounters:  06/12/22 204 lb 3.2 oz (92.6 kg)  02/12/22 205 lb (93 kg)  01/30/22 205 lb 6.4 oz (93.2 kg)    Objective:  Physical Exam Vitals reviewed.  Constitutional:      General: He is not in acute distress.    Appearance: Normal appearance.  Cardiovascular:     Rate and Rhythm: Normal rate and regular rhythm.     Pulses: Normal pulses.     Heart sounds: Normal heart sounds. No murmur heard. Pulmonary:     Effort: Pulmonary effort is normal. No respiratory distress.     Breath sounds: Normal breath sounds. No wheezing.  Skin:    General: Skin is warm and dry.     Capillary Refill: Capillary refill takes less than 2 seconds.  Neurological:     General: No focal deficit present.  Mental Status: He is alert and oriented to person, place, and time.     Cranial Nerves: No cranial nerve deficit.     Motor: No weakness.  Psychiatric:        Mood and Affect: Mood normal.        Behavior: Behavior normal.        Thought Content: Thought content normal.        Judgment: Judgment normal.         Assessment And Plan:     1. Left-sided low back pain without sciatica, unspecified chronicity Comments: No tenderness on palpation. Return to work tomorrow.   2. Uncontrolled hypertension Comments: He is not taking his amlodipine, advised to restart due to elevated blood pressure. Repeat BP improved. Advised to remain adherent to medication regimen    Patient was given opportunity to ask questions. Patient verbalized understanding of the plan and was able to repeat key elements of the plan. All questions were answered to their satisfaction.  [name], FNP   I, [name], FNP, have reviewed all documentation for  this visit. The documentation on 06/12/22 for the exam, diagnosis, procedures, and orders are all accurate and complete.   IF YOU HAVE BEEN REFERRED TO A SPECIALIST, IT MAY TAKE 1-2 WEEKS TO SCHEDULE/PROCESS THE REFERRAL. IF YOU HAVE NOT HEARD FROM US/SPECIALIST IN TWO WEEKS, PLEASE GIVE [place] A CALL AT [phone].   THE PATIENT IS ENCOURAGED TO PRACTICE SOCIAL DISTANCING DUE TO THE COVID-19 PANDEMIC.

## 2022-07-20 ENCOUNTER — Telehealth: Payer: Self-pay

## 2022-07-20 NOTE — Telephone Encounter (Signed)
Attempted to call patient to see if he had a fax number for his FMLA paper work.

## 2022-08-13 ENCOUNTER — Encounter: Payer: Self-pay | Admitting: Nurse Practitioner

## 2022-08-13 ENCOUNTER — Ambulatory Visit (INDEPENDENT_AMBULATORY_CARE_PROVIDER_SITE_OTHER): Payer: 59 | Admitting: Nurse Practitioner

## 2022-08-13 VITALS — BP 144/80 | HR 64 | Temp 98.1°F | Ht 76.0 in | Wt 199.0 lb

## 2022-08-13 DIAGNOSIS — Z2821 Immunization not carried out because of patient refusal: Secondary | ICD-10-CM

## 2022-08-13 DIAGNOSIS — E782 Mixed hyperlipidemia: Secondary | ICD-10-CM

## 2022-08-13 DIAGNOSIS — E559 Vitamin D deficiency, unspecified: Secondary | ICD-10-CM | POA: Diagnosis not present

## 2022-08-13 DIAGNOSIS — I10 Essential (primary) hypertension: Secondary | ICD-10-CM

## 2022-08-13 MED ORDER — AMLODIPINE BESYLATE 5 MG PO TABS: 5.0000 mg | ORAL_TABLET | Freq: Every day | ORAL | 1 refills | Status: DC

## 2022-08-13 NOTE — Patient Instructions (Addendum)
Hypertension, Adult High blood pressure (hypertension) is when the force of blood pumping through the arteries is too strong. The arteries are the blood vessels that carry blood from the heart throughout the body. Hypertension forces the heart to work harder to pump blood and may cause arteries to become narrow or stiff. Untreated or uncontrolled hypertension can lead to a heart attack, heart failure, a stroke, kidney disease, and other problems. A blood pressure reading consists of a higher number over a lower number. Ideally, your blood pressure should be below 120/80. The first ("top") number is called the systolic pressure. It is a measure of the pressure in your arteries as your heart beats. The second ("bottom") number is called the diastolic pressure. It is a measure of the pressure in your arteries as the heart relaxes. What are the causes? The exact cause of this condition is not known. There are some conditions that result in high blood pressure. What increases the risk? Certain factors may make you more likely to develop high blood pressure. Some of these risk factors are under your control, including: Smoking. Not getting enough exercise or physical activity. Being overweight. Having too much fat, sugar, calories, or salt (sodium) in your diet. Drinking too much alcohol. Other risk factors include: Having a personal history of heart disease, diabetes, high cholesterol, or kidney disease. Stress. Having a family history of high blood pressure and high cholesterol. Having obstructive sleep apnea. Age. The risk increases with age. What are the signs or symptoms? High blood pressure may not cause symptoms. Very high blood pressure (hypertensive crisis) may cause: Headache. Fast or irregular heartbeats (palpitations). Shortness of breath. Nosebleed. Nausea and vomiting. Vision changes. Severe chest pain, dizziness, and seizures. How is this diagnosed? This condition is diagnosed by  measuring your blood pressure while you are seated, with your arm resting on a flat surface, your legs uncrossed, and your feet flat on the floor. The cuff of the blood pressure monitor will be placed directly against the skin of your upper arm at the level of your heart. Blood pressure should be measured at least twice using the same arm. Certain conditions can cause a difference in blood pressure between your right and left arms. If you have a high blood pressure reading during one visit or you have normal blood pressure with other risk factors, you may be asked to: Return on a different day to have your blood pressure checked again. Monitor your blood pressure at home for 1 week or longer. If you are diagnosed with hypertension, you may have other blood or imaging tests to help your health care provider understand your overall risk for other conditions. How is this treated? This condition is treated by making healthy lifestyle changes, such as eating healthy foods, exercising more, and reducing your alcohol intake. You may be referred for counseling on a healthy diet and physical activity. Your health care provider may prescribe medicine if lifestyle changes are not enough to get your blood pressure under control and if: Your systolic blood pressure is above 130. Your diastolic blood pressure is above 80. Your personal target blood pressure may vary depending on your medical conditions, your age, and other factors. Follow these instructions at home: Eating and drinking  Eat a diet that is high in fiber and potassium, and low in sodium, added sugar, and fat. An example of this eating plan is called the DASH diet. DASH stands for Dietary Approaches to Stop Hypertension. To eat this way: Eat   plenty of fresh fruits and vegetables. Try to fill one half of your plate at each meal with fruits and vegetables. Eat whole grains, such as whole-wheat pasta, brown rice, or whole-grain bread. Fill about one  fourth of your plate with whole grains. Eat or drink low-fat dairy products, such as skim milk or low-fat yogurt. Avoid fatty cuts of meat, processed or cured meats, and poultry with skin. Fill about one fourth of your plate with lean proteins, such as fish, chicken without skin, beans, eggs, or tofu. Avoid pre-made and processed foods. These tend to be higher in sodium, added sugar, and fat. Reduce your daily sodium intake. Many people with hypertension should eat less than 1,500 mg of sodium a day. Do not drink alcohol if: Your health care provider tells you not to drink. You are pregnant, may be pregnant, or are planning to become pregnant. If you drink alcohol: Limit how much you have to: 0-1 drink a day for women. 0-2 drinks a day for men. Know how much alcohol is in your drink. In the [place] one drink equals one 12 oz bottle of beer (355 mL), one 5 oz glass of wine (148 mL), or one 1 oz glass of hard liquor (44 mL). Lifestyle  Work with your health care provider to maintain a healthy body weight or to lose weight. Ask what an ideal weight is for you. Get at least 30 minutes of exercise that causes your heart to beat faster (aerobic exercise) most days of the week. Activities may include walking, swimming, or biking. Include exercise to strengthen your muscles (resistance exercise), such as Pilates or lifting weights, as part of your weekly exercise routine. Try to do these types of exercises for 30 minutes at least 3 days a week. Do not use any products that contain nicotine or tobacco. These products include cigarettes, chewing tobacco, and vaping devices, such as e-cigarettes. If you need help quitting, ask your health care provider. Monitor your blood pressure at home as told by your health care provider. Keep all follow-up visits. This is important. Medicines Take over-the-counter and prescription medicines only as told by your health care provider. Follow directions carefully. Blood  pressure medicines must be taken as prescribed. Do not skip doses of blood pressure medicine. Doing this puts you at risk for problems and can make the medicine less effective. Ask your health care provider about side effects or reactions to medicines that you should watch for. Contact a health care provider if you: Think you are having a reaction to a medicine you are taking. Have headaches that keep coming back (recurring). Feel dizzy. Have swelling in your ankles. Have trouble with your vision. Get help right away if you: Develop a severe headache or confusion. Have unusual weakness or numbness. Feel faint. Have severe pain in your chest or abdomen. Vomit repeatedly. Have trouble breathing. These symptoms may be an emergency. Get help right away. Call 911. Do not wait to see if the symptoms will go away. Do not drive yourself to the hospital. Summary Hypertension is when the force of blood pumping through your arteries is too strong. If this condition is not controlled, it may put you at risk for serious complications. Your personal target blood pressure may vary depending on your medical conditions, your age, and other factors. For most people, a normal blood pressure is less than 120/80. Hypertension is treated with lifestyle changes, medicines, or a combination of both. Lifestyle changes include losing weight, eating a healthy,   low-sodium diet, exercising more, and limiting alcohol. This information is not intended to replace advice given to you by your health care provider. Make sure you discuss any questions you have with your health care provider. Document Revised: 08/29/2021 Document Reviewed: 08/29/2021 Elsevier Patient Education  2023 Else[employer] Inc.  

## 2022-08-13 NOTE — Progress Notes (Signed)
I,Tianna Badgett,acting as a Education administrator for [employer], FNP.,have documented all relevant documentation on the behalf of Minette Brine, FNP,as directed by  Minette Brine, FNP while in the presence of Minette Brine, [place].  Subjective:     Patient ID: Garrett Jones , male    DOB: 1976-03-20 , 46 y.o.   MRN: [id number]   Chief Complaint  Patient presents with   Hypertension    HPI  Patient is here for htn follow up. He is not taking his amlodipine regularly. Wanted to focus on eating better and exercise.   BP Readings from Last 3 Encounters: 08/13/22 : (!) 144/80 06/12/22 : 130/88 02/12/22 : 128/68       Past Medical History:  Diagnosis Date   Vitamin D deficiency      Family History  Problem Relation Age of Onset   Allergies Mother    Heart disease Father    Hypertension Father    Heart attack Father        X 3 prior to death due to covid   Prostate cancer Maternal Grandfather    Colon cancer Neg Hx    Esophageal cancer Neg Hx    Rectal cancer Neg Hx    Stomach cancer Neg Hx      Current Outpatient Medications:    tadalafil (CIALIS) 10 MG tablet, Take 1 tablet (10 mg total) by mouth as needed for erectile dysfunction., Disp: 20 tablet, Rfl: 1   Vitamin D, Ergocalciferol, (DRISDOL) 1.25 MG (50000 UNIT) CAPS capsule, Take 1 capsule (50,000 Units total) by mouth every 7 (seven) days., Disp: 12 capsule, Rfl: 1   amLODipine (NORVASC) 5 MG tablet, Take 1 tablet (5 mg total) by mouth daily for 180 doses., Disp: 90 tablet, Rfl: 1   No Known Allergies   Review of Systems  Constitutional: Negative.   Respiratory: Negative.    Cardiovascular: Negative.   Gastrointestinal: Negative.   Neurological: Negative.   Psychiatric/Behavioral: Negative.       Today's Vitals   08/13/22 0827  BP: (!) 144/80  Pulse: 64  Temp: 98.1 F (36.7 C)  TempSrc: Oral  Weight: 199 lb (90.3 kg)  Height: 6' 4"  (1.93 m)   Body mass index is 24.22 kg/m.  Wt Readings from Last 3 Encounters:   08/13/22 199 lb (90.3 kg)  06/12/22 204 lb 3.2 oz (92.6 kg)  02/12/22 205 lb (93 kg)    Objective:  Physical Exam Vitals reviewed.  Constitutional:      General: He is not in acute distress.    Appearance: Normal appearance.  Cardiovascular:     Rate and Rhythm: Normal rate and regular rhythm.     Pulses: Normal pulses.     Heart sounds: Normal heart sounds. No murmur heard. Pulmonary:     Effort: Pulmonary effort is normal. No respiratory distress.     Breath sounds: Normal breath sounds. No wheezing.  Skin:    General: Skin is warm and dry.     Capillary Refill: Capillary refill takes less than 2 seconds.  Neurological:     General: No focal deficit present.     Mental Status: He is alert and oriented to person, place, and time.     Cranial Nerves: No cranial nerve deficit.     Motor: No weakness.  Psychiatric:        Mood and Affect: Mood normal.        Behavior: Behavior normal.        Thought Content: Thought content  normal.        Judgment: Judgment normal.         Assessment And Plan:     1. Uncontrolled hypertension Comments: Elevated today, stressed the importance of being adherent to medications. Discussed pathophysiology of poorly controlled hypertension. Risk can lead to renal failure and congestive heart failure. Repeat blood pressure is better.  - amLODipine (NORVASC) 5 MG tablet; Take 1 tablet (5 mg total) by mouth daily for 180 doses.  Dispense: 90 tablet; Refill: 1  2. Vitamin D deficiency Comments: Currently well controlled, continue supplement.   3. Mixed hyperlipidemia Comments: Stable, diet controlled. Continue focusing on healthy diet low in fat - BMP8+EGFR - Lipid panel  4. Influenza vaccination declined Patient declined influenza vaccination at this time. Patient is aware that influenza vaccine prevents illness in 70% of healthy people, and reduces hospitalizations to 30-70% in elderly. This vaccine is recommended annually. Education has  been provided regarding the importance of this vaccine but patient still declined. Advised may receive this vaccine at local pharmacy or Health Dept.or vaccine clinic. Aware to provide a copy of the vaccination record if obtained from local pharmacy or Health Dept.  Pt is willing to accept risk associated with refusing vaccination.   Patient was given opportunity to ask questions. Patient verbalized understanding of the plan and was able to repeat key elements of the plan. All questions were answered to their satisfaction.  Minette Brine, FNP   I, Minette Brine, FNP, have reviewed all documentation for this visit. The documentation on 08/13/22 for the exam, diagnosis, procedures, and orders are all accurate and complete.   IF YOU HAVE BEEN REFERRED TO A SPECIALIST, IT MAY TAKE 1-2 WEEKS TO SCHEDULE/PROCESS THE REFERRAL. IF YOU HAVE NOT HEARD FROM US/SPECIALIST IN TWO WEEKS, PLEASE GIVE [place] A CALL AT [phone].   THE PATIENT IS ENCOURAGED TO PRACTICE SOCIAL DISTANCING DUE TO THE COVID-19 PANDEMIC.

## 2022-08-14 LAB — LIPID PANEL
Chol/HDL Ratio: 3.1 ratio (ref 0.0–5.0)
Cholesterol, Total: 156 mg/dL (ref 100–199)
HDL: 51 mg/dL (ref >39)
LDL Chol Calc (NIH): 84 mg/dL (ref 0–99)
Triglycerides: 115 mg/dL (ref 0–149)
VLDL Cholesterol Cal: 21 mg/dL (ref 5–40)

## 2022-08-14 LAB — BMP8+EGFR
BUN/Creatinine Ratio: 10 (ref 9–20)
BUN: 11 mg/dL (ref 6–24)
CO2: 22 mmol/L (ref 20–29)
Calcium: 9 mg/dL (ref 8.7–10.2)
Chloride: 105 mmol/L (ref 96–106)
Creatinine, Ser: 1.13 mg/dL (ref 0.76–1.27)
Glucose: 99 mg/dL (ref 70–99)
Potassium: 4.6 mmol/L (ref 3.5–5.2)
Sodium: 141 mmol/L (ref 134–144)
eGFR: 81 mL/min/{1.73_m2} (ref >59)

## 2022-11-26 ENCOUNTER — Ambulatory Visit (INDEPENDENT_AMBULATORY_CARE_PROVIDER_SITE_OTHER): Payer: 59 | Admitting: Nurse Practitioner

## 2022-11-26 ENCOUNTER — Encounter: Payer: Self-pay | Admitting: Nurse Practitioner

## 2022-11-26 VITALS — BP 158/88 | HR 58 | Temp 98.7°F | Ht 76.0 in | Wt 199.0 lb

## 2022-11-26 DIAGNOSIS — Z79899 Other long term (current) drug therapy: Secondary | ICD-10-CM

## 2022-11-26 DIAGNOSIS — E782 Mixed hyperlipidemia: Secondary | ICD-10-CM | POA: Diagnosis not present

## 2022-11-26 DIAGNOSIS — R809 Proteinuria, unspecified: Secondary | ICD-10-CM

## 2022-11-26 DIAGNOSIS — Z0001 Encounter for general adult medical examination with abnormal findings: Secondary | ICD-10-CM

## 2022-11-26 DIAGNOSIS — I10 Essential (primary) hypertension: Secondary | ICD-10-CM | POA: Diagnosis not present

## 2022-11-26 DIAGNOSIS — E559 Vitamin D deficiency, unspecified: Secondary | ICD-10-CM

## 2022-11-26 DIAGNOSIS — Z0000 Encounter for general adult medical examination without abnormal findings: Secondary | ICD-10-CM

## 2022-11-26 DIAGNOSIS — Z125 Encounter for screening for malignant neoplasm of prostate: Secondary | ICD-10-CM

## 2022-11-26 LAB — POCT URINALYSIS DIPSTICK
Bilirubin, UA: NEGATIVE
Blood, UA: NEGATIVE
Glucose, UA: NEGATIVE
Ketones, UA: NEGATIVE
Leukocytes, UA: NEGATIVE
Nitrite, UA: NEGATIVE
Protein, UA: NEGATIVE
Spec Grav, UA: >=1.03 — AB (ref 1.010–1.025)
Urobilinogen, UA: 0.2 E.U./dL
pH, UA: 6 (ref 5.0–8.0)

## 2022-11-26 NOTE — Progress Notes (Signed)
I,Tianna Badgett,acting as a Education administrator for [employer], FNP.,have documented all relevant documentation on the behalf of Minette Brine, FNP,as directed by  Minette Brine, FNP while in the presence of Minette Brine, [place].  Subjective:     Patient ID: Garrett Jones , male    DOB: July 13, 1976 , 47 y.o.   MRN: [id number]   Chief Complaint  Patient presents with   Annual Exam    HPI  Pt here for HM.  He has stopped taking the medication feeling he can improve the blood pressure on his own. His father had a history of hypertension. He does feel rested after sleeping a good night. But will sleep 4 good hours then wake up and have difficulty with going back to sleep.      Past Medical History:  Diagnosis Date   Vitamin D deficiency      Family History  Problem Relation Age of Onset   Allergies Mother    Heart disease Father    Hypertension Father    Heart attack Father        X 3 prior to death due to covid   Prostate cancer Maternal Grandfather    Colon cancer Neg Hx    Esophageal cancer Neg Hx    Rectal cancer Neg Hx    Stomach cancer Neg Hx      Current Outpatient Medications:    amLODipine (NORVASC) 5 MG tablet, Take 1 tablet (5 mg total) by mouth daily for 180 doses., Disp: 90 tablet, Rfl: 1   tadalafil (CIALIS) 10 MG tablet, Take 1 tablet (10 mg total) by mouth as needed for erectile dysfunction., Disp: 20 tablet, Rfl: 1   Vitamin D, Ergocalciferol, (DRISDOL) 1.25 MG (50000 UNIT) CAPS capsule, Take 1 capsule (50,000 Units total) by mouth every 7 (seven) days., Disp: 12 capsule, Rfl: 1   No Known Allergies   Men's preventive visit. Patient Health Questionnaire (PHQ-2) is  [place] Office Visit from 11/26/2022 in [facility]  PHQ-2 Total Score 0      Patient is on a Regular diet. Exercising - not as much in the last 2 months. Marital status: Significant Other. Relevant history for alcohol use is:  Social History   Substance and Sexual  Activity  Alcohol Use Yes   Alcohol/week: 3.0 standard drinks of alcohol   Types: 3 Cans of beer per week   Comment: ocassionally  . Relevant history for tobacco use is:  Social History   Tobacco Use  Smoking Status Never  Smokeless Tobacco Never  .   Review of Systems  Constitutional: Negative.   HENT: Negative.    Eyes: Negative.   Respiratory: Negative.    Cardiovascular: Negative.   Gastrointestinal: Negative.   Endocrine: Negative.   Genitourinary: Negative.   Musculoskeletal: Negative.   Skin: Negative.   Allergic/Immunologic: Negative.   Neurological: Negative.   Hematological: Negative.   Psychiatric/Behavioral: Negative.       Today's Vitals   11/26/22 1521 11/26/22 1536  BP: (!) 160/90 (!) 158/88  Pulse: (!) 58   Temp: 98.7 F (37.1 C)   TempSrc: Oral   Weight: 199 lb (90.3 kg)   Height: 6\' 4"  (1.93 m)    Body mass index is 24.22 kg/m.   Objective:  Physical Exam Vitals reviewed.  Constitutional:      General: He is not in acute distress.    Appearance: Normal appearance.  HENT:     Head: Normocephalic and atraumatic.  Right Ear: Tympanic membrane, ear canal and external ear normal. There is no impacted cerumen.     Left Ear: Tympanic membrane, ear canal and external ear normal. There is no impacted cerumen.     Nose:     Comments: Deferred - masked    Mouth/Throat:     Comments: Deferred - masked Eyes:     Pupils: Pupils are equal, round, and reactive to light.  Cardiovascular:     Rate and Rhythm: Normal rate and regular rhythm.     Pulses: Normal pulses.     Heart sounds: Normal heart sounds. No murmur heard. Pulmonary:     Effort: Pulmonary effort is normal. No respiratory distress.     Breath sounds: Normal breath sounds. No wheezing.  Abdominal:     General: Abdomen is flat. Bowel sounds are normal. There is no distension.     Palpations: Abdomen is soft.     Tenderness: There is no abdominal tenderness.  Musculoskeletal:         General: No swelling or tenderness. Normal range of motion.     Cervical back: Normal range of motion and neck supple.  Skin:    General: Skin is warm.     Capillary Refill: Capillary refill takes less than 2 seconds.  Neurological:     General: No focal deficit present.     Mental Status: He is alert and oriented to person, place, and time.     Cranial Nerves: No cranial nerve deficit.     Motor: No weakness.  Psychiatric:        Mood and Affect: Mood normal.        Behavior: Behavior normal.        Thought Content: Thought content normal.        Judgment: Judgment normal.         Assessment And Plan:    1. Encounter for annual physical exam Behavior modifications discussed and diet history reviewed.   Pt will continue to exercise regularly and modify diet with low GI, plant based foods and decrease intake of processed foods.  Recommend intake of daily multivitamin, Vitamin D, and calcium.  Recommend colonoscopy for preventive screenings, as well as recommend immunizations that include influenza, TDAP  2. Encounter for prostate cancer screening - PSA  3. Uncontrolled hypertension Comments: Stressed with him on multiple occasions to continue his BP medications to decrease his risk for stroke or heart attack.  He plans to restart medications. Will check renal ultrasound to evaluate for any abnormal stenosis. Will also refer to sleep provider to evaluate for sleep apnea since continues to have elevated blood pressure - POCT Urinalysis Dipstick (81002) - Microalbumin / Creatinine Urine Ratio - EKG 12-Lead - Ambulatory referral to Sleep Studies - TSH - US RENAL ARTERY DUPLEX COMPLETE; Future  4. Vitamin D deficiency Will check vitamin D level and supplement as needed.    Also encouraged to spend 15 minutes in the sun daily.  - VITAMIN D 25 Hydroxy (Vit-D Deficiency, Fractures)  5. Proteinuria, unspecified type Comments: Check a renal ultrasound due to persistent proteinuria.   Also concerning due to elevated blood pressure  6. Mixed hyperlipidemia Comments: Stable, diet controlled.  Continue focusing on low-fat diet. - CMP14+EGFR - Lipid panel  7. Other long term (current) drug therapy - CBC   Patient was given opportunity to ask questions. Patient verbalized understanding of the plan and was able to repeat key elements of the plan. All questions were answered to  their satisfaction.   [name], FNP   I, [name], FNP, have reviewed all documentation for this visit. The documentation on 11/26/22 for the exam, diagnosis, procedures, and orders are all accurate and complete.   THE PATIENT IS ENCOURAGED TO PRACTICE SOCIAL DISTANCING DUE TO THE COVID-19 PANDEMIC.

## 2022-11-26 NOTE — Patient Instructions (Signed)
Health Maintenance, Male Adopting a healthy lifestyle and getting preventive care are important in promoting health and wellness. Ask your health care provider about: The right schedule for you to have regular tests and exams. Things you can do on your own to prevent diseases and keep yourself healthy. What should I know about diet, weight, and exercise? Eat a healthy diet  Eat a diet that includes plenty of vegetables, fruits, low-fat dairy products, and lean protein. Do not eat a lot of foods that are high in solid fats, added sugars, or sodium. Maintain a healthy weight Body mass index (BMI) is a measurement that can be used to identify possible weight problems. It estimates body fat based on height and weight. Your health care provider can help determine your BMI and help you achieve or maintain a healthy weight. Get regular exercise Get regular exercise. This is one of the most important things you can do for your health. Most adults should: Exercise for at least 150 minutes each week. The exercise should increase your heart rate and make you sweat (moderate-intensity exercise). Do strengthening exercises at least twice a week. This is in addition to the moderate-intensity exercise. Spend less time sitting. Even light physical activity can be beneficial. Watch cholesterol and blood lipids Have your blood tested for lipids and cholesterol at 48 years of age, then have this test every 5 years. You may need to have your cholesterol levels checked more often if: Your lipid or cholesterol levels are high. You are older than 48 years of age. You are at high risk for heart disease. What should I know about cancer screening? Many types of cancers can be detected early and may often be prevented. Depending on your health history and family history, you may need to have cancer screening at various ages. This may include screening for: Colorectal cancer. Prostate cancer. Skin cancer. Lung  cancer. What should I know about heart disease, diabetes, and high blood pressure? Blood pressure and heart disease High blood pressure causes heart disease and increases the risk of stroke. This is more likely to develop in people who have high blood pressure readings or are overweight. Talk with your health care provider about your target blood pressure readings. Have your blood pressure checked: Every 3-5 years if you are 18-39 years of age. Every year if you are 40 years old or older. If you are between the ages of 65 and 75 and are a current or former smoker, ask your health care provider if you should have a one-time screening for abdominal aortic aneurysm (AAA). Diabetes Have regular diabetes screenings. This checks your fasting blood sugar level. Have the screening done: Once every three years after age 45 if you are at a normal weight and have a low risk for diabetes. More often and at a younger age if you are overweight or have a high risk for diabetes. What should I know about preventing infection? Hepatitis B If you have a higher risk for hepatitis B, you should be screened for this virus. Talk with your health care provider to find out if you are at risk for hepatitis B infection. Hepatitis C Blood testing is recommended for: Everyone born from 1945 through 1965. Anyone with known risk factors for hepatitis C. Sexually transmitted infections (STIs) You should be screened each year for STIs, including gonorrhea and chlamydia, if: You are sexually active and are younger than 48 years of age. You are older than 48 years of age and your   health care provider tells you that you are at risk for this type of infection. Your sexual activity has changed since you were last screened, and you are at increased risk for chlamydia or gonorrhea. Ask your health care provider if you are at risk. Ask your health care provider about whether you are at high risk for HIV. Your health care provider  may recommend a prescription medicine to help prevent HIV infection. If you choose to take medicine to prevent HIV, you should first get tested for HIV. You should then be tested every 3 months for as long as you are taking the medicine. Follow these instructions at home: Alcohol use Do not drink alcohol if your health care provider tells you not to drink. If you drink alcohol: Limit how much you have to 0-2 drinks a day. Know how much alcohol is in your drink. In the [place] one drink equals one 12 oz bottle of beer (355 mL), one 5 oz glass of wine (148 mL), or one 1 oz glass of hard liquor (44 mL). Lifestyle Do not use any products that contain nicotine or tobacco. These products include cigarettes, chewing tobacco, and vaping devices, such as e-cigarettes. If you need help quitting, ask your health care provider. Do not use street drugs. Do not share needles. Ask your health care provider for help if you need support or information about quitting drugs. General instructions Schedule regular health, dental, and eye exams. Stay current with your vaccines. Tell your health care provider if: You often feel depressed. You have ever been abused or do not feel safe at home. Summary Adopting a healthy lifestyle and getting preventive care are important in promoting health and wellness. Follow your health care provider's instructions about healthy diet, exercising, and getting tested or screened for diseases. Follow your health care provider's instructions on monitoring your cholesterol and blood pressure. This information is not intended to replace advice given to you by your health care provider. Make sure you discuss any questions you have with your health care provider. Document Revised: 03/13/2021 Document Reviewed: 03/13/2021 Elsevier Patient Education  2023 [employer]  

## 2022-11-27 LAB — CBC
Hematocrit: 42.7 % (ref 37.5–51.0)
Hemoglobin: 14.2 g/dL (ref 13.0–17.7)
MCH: 28.1 pg (ref 26.6–33.0)
MCHC: 33.3 g/dL (ref 31.5–35.7)
MCV: 84 fL (ref 79–97)
Platelets: 139 10*3/uL — ABNORMAL LOW (ref 150–450)
RBC: 5.06 x10E6/uL (ref 4.14–5.80)
RDW: 13.1 % (ref 11.6–15.4)
WBC: 5.6 10*3/uL (ref 3.4–10.8)

## 2022-11-27 LAB — CMP14+EGFR
ALT: 13 IU/L (ref 0–44)
AST: 19 IU/L (ref 0–40)
Albumin/Globulin Ratio: 2 (ref 1.2–2.2)
Albumin: 4.4 g/dL (ref 4.1–5.1)
Alkaline Phosphatase: 69 IU/L (ref 44–121)
BUN/Creatinine Ratio: 13 (ref 9–20)
BUN: 14 mg/dL (ref 6–24)
Bilirubin Total: 0.4 mg/dL (ref 0.0–1.2)
CO2: 24 mmol/L (ref 20–29)
Calcium: 9.1 mg/dL (ref 8.7–10.2)
Chloride: 102 mmol/L (ref 96–106)
Creatinine, Ser: 1.06 mg/dL (ref 0.76–1.27)
Globulin, Total: 2.2 g/dL (ref 1.5–4.5)
Glucose: 83 mg/dL (ref 70–99)
Potassium: 4.2 mmol/L (ref 3.5–5.2)
Sodium: 141 mmol/L (ref 134–144)
Total Protein: 6.6 g/dL (ref 6.0–8.5)
eGFR: 87 mL/min/{1.73_m2} (ref >59)

## 2022-11-27 LAB — LIPID PANEL
Chol/HDL Ratio: 2.5 ratio (ref 0.0–5.0)
Cholesterol, Total: 158 mg/dL (ref 100–199)
HDL: 63 mg/dL (ref >39)
LDL Chol Calc (NIH): 72 mg/dL (ref 0–99)
Triglycerides: 130 mg/dL (ref 0–149)
VLDL Cholesterol Cal: 23 mg/dL (ref 5–40)

## 2022-11-27 LAB — MICROALBUMIN / CREATININE URINE RATIO
Creatinine, Urine: 208.4 mg/dL
Microalb/Creat Ratio: 2 mg/g creat (ref 0–29)
Microalbumin, Urine: 5.1 ug/mL

## 2022-11-27 LAB — TSH: TSH: 5.06 u[IU]/mL — ABNORMAL HIGH (ref 0.450–4.500)

## 2022-11-27 LAB — VITAMIN D 25 HYDROXY (VIT D DEFICIENCY, FRACTURES): Vit D, 25-Hydroxy: 25.1 ng/mL — ABNORMAL LOW (ref 30.0–100.0)

## 2022-11-27 LAB — PSA: Prostate Specific Ag, Serum: 0.5 ng/mL (ref 0.0–4.0)

## 2022-12-09 NOTE — Progress Notes (Signed)
Add a repeat CBC for the date he has an upcoming lab appt

## 2022-12-10 ENCOUNTER — Other Ambulatory Visit: Payer: Self-pay

## 2022-12-10 DIAGNOSIS — I10 Essential (primary) hypertension: Secondary | ICD-10-CM

## 2022-12-11 ENCOUNTER — Ambulatory Visit: Payer: 59

## 2022-12-14 ENCOUNTER — Ambulatory Visit: Payer: 59

## 2022-12-14 VITALS — BP 128/78 | HR 60 | Temp 98.4°F

## 2022-12-14 DIAGNOSIS — I10 Essential (primary) hypertension: Secondary | ICD-10-CM

## 2022-12-14 NOTE — Patient Instructions (Signed)
Hypertension, Adult High blood pressure (hypertension) is when the force of blood pumping through the arteries is too strong. The arteries are the blood vessels that carry blood from the heart throughout the body. Hypertension forces the heart to work harder to pump blood and may cause arteries to become narrow or stiff. Untreated or uncontrolled hypertension can lead to a heart attack, heart failure, a stroke, kidney disease, and other problems. A blood pressure reading consists of a higher number over a lower number. Ideally, your blood pressure should be below 120/80. The first ("top") number is called the systolic pressure. It is a measure of the pressure in your arteries as your heart beats. The second ("bottom") number is called the diastolic pressure. It is a measure of the pressure in your arteries as the heart relaxes. What are the causes? The exact cause of this condition is not known. There are some conditions that result in high blood pressure. What increases the risk? Certain factors may make you more likely to develop high blood pressure. Some of these risk factors are under your control, including: Smoking. Not getting enough exercise or physical activity. Being overweight. Having too much fat, sugar, calories, or salt (sodium) in your diet. Drinking too much alcohol. Other risk factors include: Having a personal history of heart disease, diabetes, high cholesterol, or kidney disease. Stress. Having a family history of high blood pressure and high cholesterol. Having obstructive sleep apnea. Age. The risk increases with age. What are the signs or symptoms? High blood pressure may not cause symptoms. Very high blood pressure (hypertensive crisis) may cause: Headache. Fast or irregular heartbeats (palpitations). Shortness of breath. Nosebleed. Nausea and vomiting. Vision changes. Severe chest pain, dizziness, and seizures. How is this diagnosed? This condition is diagnosed by  measuring your blood pressure while you are seated, with your arm resting on a flat surface, your legs uncrossed, and your feet flat on the floor. The cuff of the blood pressure monitor will be placed directly against the skin of your upper arm at the level of your heart. Blood pressure should be measured at least twice using the same arm. Certain conditions can cause a difference in blood pressure between your right and left arms. If you have a high blood pressure reading during one visit or you have normal blood pressure with other risk factors, you may be asked to: Return on a different day to have your blood pressure checked again. Monitor your blood pressure at home for 1 week or longer. If you are diagnosed with hypertension, you may have other blood or imaging tests to help your health care provider understand your overall risk for other conditions. How is this treated? This condition is treated by making healthy lifestyle changes, such as eating healthy foods, exercising more, and reducing your alcohol intake. You may be referred for counseling on a healthy diet and physical activity. Your health care provider may prescribe medicine if lifestyle changes are not enough to get your blood pressure under control and if: Your systolic blood pressure is above 130. Your diastolic blood pressure is above 80. Your personal target blood pressure may vary depending on your medical conditions, your age, and other factors. Follow these instructions at home: Eating and drinking  Eat a diet that is high in fiber and potassium, and low in sodium, added sugar, and fat. An example of this eating plan is called the DASH diet. DASH stands for Dietary Approaches to Stop Hypertension. To eat this way: Eat   plenty of fresh fruits and vegetables. Try to fill one half of your plate at each meal with fruits and vegetables. Eat whole grains, such as whole-wheat pasta, brown rice, or whole-grain bread. Fill about one  fourth of your plate with whole grains. Eat or drink low-fat dairy products, such as skim milk or low-fat yogurt. Avoid fatty cuts of meat, processed or cured meats, and poultry with skin. Fill about one fourth of your plate with lean proteins, such as fish, chicken without skin, beans, eggs, or tofu. Avoid pre-made and processed foods. These tend to be higher in sodium, added sugar, and fat. Reduce your daily sodium intake. Many people with hypertension should eat less than 1,500 mg of sodium a day. Do not drink alcohol if: Your health care provider tells you not to drink. You are pregnant, may be pregnant, or are planning to become pregnant. If you drink alcohol: Limit how much you have to: 0-1 drink a day for women. 0-2 drinks a day for men. Know how much alcohol is in your drink. In the [place] one drink equals one 12 oz bottle of beer (355 mL), one 5 oz glass of wine (148 mL), or one 1 oz glass of hard liquor (44 mL). Lifestyle  Work with your health care provider to maintain a healthy body weight or to lose weight. Ask what an ideal weight is for you. Get at least 30 minutes of exercise that causes your heart to beat faster (aerobic exercise) most days of the week. Activities may include walking, swimming, or biking. Include exercise to strengthen your muscles (resistance exercise), such as Pilates or lifting weights, as part of your weekly exercise routine. Try to do these types of exercises for 30 minutes at least 3 days a week. Do not use any products that contain nicotine or tobacco. These products include cigarettes, chewing tobacco, and vaping devices, such as e-cigarettes. If you need help quitting, ask your health care provider. Monitor your blood pressure at home as told by your health care provider. Keep all follow-up visits. This is important. Medicines Take over-the-counter and prescription medicines only as told by your health care provider. Follow directions carefully. Blood  pressure medicines must be taken as prescribed. Do not skip doses of blood pressure medicine. Doing this puts you at risk for problems and can make the medicine less effective. Ask your health care provider about side effects or reactions to medicines that you should watch for. Contact a health care provider if you: Think you are having a reaction to a medicine you are taking. Have headaches that keep coming back (recurring). Feel dizzy. Have swelling in your ankles. Have trouble with your vision. Get help right away if you: Develop a severe headache or confusion. Have unusual weakness or numbness. Feel faint. Have severe pain in your chest or abdomen. Vomit repeatedly. Have trouble breathing. These symptoms may be an emergency. Get help right away. Call 911. Do not wait to see if the symptoms will go away. Do not drive yourself to the hospital. Summary Hypertension is when the force of blood pumping through your arteries is too strong. If this condition is not controlled, it may put you at risk for serious complications. Your personal target blood pressure may vary depending on your medical conditions, your age, and other factors. For most people, a normal blood pressure is less than 120/80. Hypertension is treated with lifestyle changes, medicines, or a combination of both. Lifestyle changes include losing weight, eating a healthy,   low-sodium diet, exercising more, and limiting alcohol. This information is not intended to replace advice given to you by your health care provider. Make sure you discuss any questions you have with your health care provider. Document Revised: 08/29/2021 Document Reviewed: 08/29/2021 Elsevier Patient Education  2023 Elsevier Inc.  

## 2022-12-14 NOTE — Progress Notes (Signed)
Patient presents today for BPC. Patient is currently taking Amlodipine 23m.   BP Readings from Last 3 Encounters:  12/14/22 128/78  11/26/22 (!) 158/88  08/13/22 (!) 144/80   Provider would like to continue with current medications and follow up as scheduled.

## 2022-12-31 ENCOUNTER — Ambulatory Visit
Admission: RE | Admit: 2022-12-31 | Discharge: 2022-12-31 | Disposition: A | Discharge status: Home / Self Care | Payer: 59 | Source: Ambulatory Visit | Attending: Nurse Practitioner | Admitting: Nurse Practitioner

## 2022-12-31 DIAGNOSIS — I10 Essential (primary) hypertension: Secondary | ICD-10-CM

## 2023-01-14 ENCOUNTER — Other Ambulatory Visit: Payer: 59

## 2023-01-14 DIAGNOSIS — I10 Essential (primary) hypertension: Secondary | ICD-10-CM

## 2023-01-14 LAB — CBC
Hematocrit: 45 % (ref 37.5–51.0)
Hemoglobin: 15.5 g/dL (ref 13.0–17.7)
MCH: 28.7 pg (ref 26.6–33.0)
MCHC: 34.4 g/dL (ref 31.5–35.7)
MCV: 83 fL (ref 79–97)
Platelets: 133 10*3/uL — ABNORMAL LOW (ref 150–450)
RBC: 5.4 x10E6/uL (ref 4.14–5.80)
RDW: 12.8 % (ref 11.6–15.4)
WBC: 4.6 10*3/uL (ref 3.4–10.8)

## 2023-03-27 ENCOUNTER — Ambulatory Visit: Payer: 59 | Admitting: Nurse Practitioner

## 2023-05-22 ENCOUNTER — Encounter: Payer: Self-pay | Admitting: Nurse Practitioner

## 2023-05-22 ENCOUNTER — Ambulatory Visit (INDEPENDENT_AMBULATORY_CARE_PROVIDER_SITE_OTHER): Payer: 59 | Admitting: Nurse Practitioner

## 2023-05-22 VITALS — BP 140/84 | HR 70 | Temp 98.1°F | Ht 76.0 in | Wt 203.0 lb

## 2023-05-22 DIAGNOSIS — R1012 Left upper quadrant pain: Secondary | ICD-10-CM | POA: Diagnosis not present

## 2023-05-22 DIAGNOSIS — I10 Essential (primary) hypertension: Secondary | ICD-10-CM | POA: Diagnosis not present

## 2023-05-22 DIAGNOSIS — R1032 Left lower quadrant pain: Secondary | ICD-10-CM

## 2023-05-22 DIAGNOSIS — Z5320 Procedure and treatment not carried out because of patient's decision for unspecified reasons: Secondary | ICD-10-CM | POA: Diagnosis not present

## 2023-05-22 MED ORDER — AMLODIPINE BESYLATE 5 MG PO TABS: 5.0000 mg | ORAL_TABLET | Freq: Every day | ORAL | 1 refills | Status: DC

## 2023-05-22 NOTE — Progress Notes (Signed)
I,Jameka J Llittleton, CMA,acting as a Neurosurgeon for [employer], FNP.,have documented all relevant documentation on the behalf of Garrett Felts, FNP,as directed by  Garrett Felts, FNP while in the presence of Garrett Felts, FNP.  Subjective:  Patient ID: Garrett Jones , male    DOB: 12-13-1975 , 47 y.o.   MRN: [id number]  Chief Complaint  Patient presents with   Hypertension    HPI  Patient presents today for bp check. Patient reports he has not been taking his bp med.     Hypertension This is a chronic problem. The current episode started more than 1 year ago. The problem is uncontrolled. Pertinent negatives include no anxiety, chest pain, headaches or palpitations. Risk factors for coronary artery disease include sedentary lifestyle and male gender.  Abdominal Pain This is a new problem. The current episode started 1 to 4 weeks ago. The onset quality is sudden. The problem has been unchanged. The pain is located in the LUQ. The quality of the pain is aching. Pertinent negatives include no constipation, diarrhea, frequency, headaches, hematuria, nausea or vomiting. The pain is relieved by Eating. He has tried nothing for the symptoms.     Past Medical History:  Diagnosis Date   Chest pain 06/05/2021   COVID-19 virus infection 07/28/2020   Fatigue 10/21/2019   Urinary hesitancy 10/21/2019   Vitamin D deficiency      Family History  Problem Relation Age of Onset   Allergies Mother    Heart disease Father    Hypertension Father    Heart attack Father        X 3 prior to death due to covid   Prostate cancer Maternal Grandfather    Colon cancer Neg Hx    Esophageal cancer Neg Hx    Rectal cancer Neg Hx    Stomach cancer Neg Hx      Current Outpatient Medications:    amLODipine (NORVASC) 5 MG tablet, Take 1 tablet (5 mg total) by mouth daily for 180 doses., Disp: 90 tablet, Rfl: 1   Vitamin D, Ergocalciferol, (DRISDOL) 1.25 MG (50000 UNIT) CAPS capsule, Take 1 capsule (50,000  Units total) by mouth every 7 (seven) days. (Patient not taking: Reported on 05/22/2023), Disp: 12 capsule, Rfl: 1   No Known Allergies   Review of Systems  Constitutional: Negative.   Respiratory: Negative.    Cardiovascular:  Negative for chest pain, palpitations and leg swelling.  Gastrointestinal:  Positive for abdominal pain. Negative for constipation, diarrhea, nausea and vomiting.  Genitourinary:  Negative for frequency and hematuria.  Neurological: Negative.  Negative for headaches.  Psychiatric/Behavioral: Negative.       Today's Vitals   05/22/23 1609 05/22/23 1644  BP: (!) 140/88 (!) 140/84  Pulse: 70   Temp: 98.1 F (36.7 C)   Weight: 203 lb (92.1 kg)   Height: 6\' 4"  (1.93 m)   PainSc: 0-No pain    Body mass index is 24.71 kg/m.  Wt Readings from Last 3 Encounters:  05/22/23 203 lb (92.1 kg)  11/26/22 199 lb (90.3 kg)  08/13/22 199 lb (90.3 kg)     Objective:  Physical Exam Vitals reviewed.  Constitutional:      General: He is not in acute distress.    Appearance: Normal appearance.  Cardiovascular:     Rate and Rhythm: Normal rate and regular rhythm.     Pulses: Normal pulses.     Heart sounds: Normal heart sounds. No murmur heard. Pulmonary:     Effort:  Pulmonary effort is normal. No respiratory distress.     Breath sounds: Normal breath sounds. No wheezing.  Skin:    General: Skin is warm and dry.     Capillary Refill: Capillary refill takes less than 2 seconds.  Neurological:     General: No focal deficit present.     Mental Status: He is alert and oriented to person, place, and time.     Cranial Nerves: No cranial nerve deficit.     Motor: No weakness.         Assessment And Plan:  Uncontrolled hypertension Assessment & Plan: Blood pressure is poorly controlled today, has not been taking his medications. I did send a refill and advised to take daily. Repeat slightly improved. Return to office in 2 weeks for NV BP check.   Orders: -      BMP8+eGFR -     amLODIPine Besylate; Take 1 tablet (5 mg total) by mouth daily for 180 doses.  Dispense: 90 tablet; Refill: 1  Left lower quadrant abdominal pain Assessment & Plan: Tenderness left upper quadrant, will check amylase, CMP and CT scan of abdomen.  Orders: -     BMP8+eGFR -     Lipase -     Amylase -     CT ABDOMEN PELVIS WO CONTRAST; Future -     CBC  Patient declines to take medication Assessment & Plan: Discussed risk of not taking blood pressure medications to increase risk for heart disease, kidney failure or cardiac event.    Left upper quadrant abdominal pain Assessment & Plan: Tenderness left upper quadrant, will check amylase, CMP and CT scan of abdomen.    Return in about 3 months (around 08/22/2023) for  2 WEEK MORNING NV- 10M BP.   Patient was given opportunity to ask questions. Patient verbalized understanding of the plan and was able to repeat key elements of the plan. All questions were answered to their satisfaction.    [name], FNP, have reviewed all documentation for this visit. The documentation on 05/22/23 for the exam, diagnosis, procedures, and orders are all accurate and complete.   IF YOU HAVE BEEN REFERRED TO A SPECIALIST, IT MAY TAKE 1-2 WEEKS TO SCHEDULE/PROCESS THE REFERRAL. IF YOU HAVE NOT HEARD FROM US/SPECIALIST IN TWO WEEKS, PLEASE GIVE [place] A CALL AT [phone].

## 2023-05-23 LAB — BMP8+EGFR
BUN/Creatinine Ratio: 10 (ref 9–20)
BUN: 12 mg/dL (ref 6–24)
CO2: 23 mmol/L (ref 20–29)
Calcium: 9 mg/dL (ref 8.7–10.2)
Chloride: 104 mmol/L (ref 96–106)
Creatinine, Ser: 1.2 mg/dL (ref 0.76–1.27)
Glucose: 101 mg/dL — ABNORMAL HIGH (ref 70–99)
Potassium: 4.2 mmol/L (ref 3.5–5.2)
Sodium: 141 mmol/L (ref 134–144)
eGFR: 75 mL/min/{1.73_m2} (ref >59)

## 2023-05-23 LAB — CBC
Hematocrit: 45.5 % (ref 37.5–51.0)
Hemoglobin: 15.2 g/dL (ref 13.0–17.7)
MCH: 27.9 pg (ref 26.6–33.0)
MCHC: 33.4 g/dL (ref 31.5–35.7)
MCV: 84 fL (ref 79–97)
Platelets: 137 10*3/uL — ABNORMAL LOW (ref 150–450)
RBC: 5.45 x10E6/uL (ref 4.14–5.80)
RDW: 12.6 % (ref 11.6–15.4)
WBC: 4.5 10*3/uL (ref 3.4–10.8)

## 2023-05-23 LAB — LIPASE: Lipase: 33 U/L (ref 13–78)

## 2023-05-23 LAB — AMYLASE: Amylase: 68 U/L (ref 31–110)

## 2023-06-04 ENCOUNTER — Encounter: Payer: Self-pay | Admitting: Nurse Practitioner

## 2023-06-04 DIAGNOSIS — R1012 Left upper quadrant pain: Secondary | ICD-10-CM | POA: Insufficient documentation

## 2023-06-04 NOTE — Assessment & Plan Note (Signed)
Tenderness left upper quadrant, will check amylase, CMP and CT scan of abdomen.

## 2023-06-04 NOTE — Assessment & Plan Note (Addendum)
Blood pressure is poorly controlled today, has not been taking his medications. I did send a refill and advised to take daily. Repeat slightly improved. Return to office in 2 weeks for NV BP check.

## 2023-06-04 NOTE — Assessment & Plan Note (Signed)
Discussed risk of not taking blood pressure medications to increase risk for heart disease, kidney failure or cardiac event.

## 2023-06-07 ENCOUNTER — Ambulatory Visit: Payer: 59

## 2023-06-07 VITALS — BP 151/101 | HR 61 | Temp 98.1°F | Ht 76.0 in | Wt 203.0 lb

## 2023-06-07 DIAGNOSIS — I10 Essential (primary) hypertension: Secondary | ICD-10-CM

## 2023-06-07 NOTE — Progress Notes (Unsigned)
Patient presents for bpc. He currently takes Amlodipine 5MG . He admits not taking bp medicine before coming in today. Denies headache, chest pain, sob.  BP Readings from Last 3 Encounters:  06/07/23 (!) 151/101  05/22/23 (!) 140/84  12/14/22 128/78  Per provider patient is to take Amlodipine once daily. Patient aware. Appointment scheduled for patient to follow up. Patient notified.

## 2023-06-07 NOTE — Patient Instructions (Signed)
Hypertension, Adult Hypertension is another name for high blood pressure. High blood pressure forces your heart to work harder to pump blood. This can cause problems over time. There are two numbers in a blood pressure reading. There is a top number (systolic) over a bottom number (diastolic). It is best to have a blood pressure that is below 120/80. What are the causes? The cause of this condition is not known. Some other conditions can lead to high blood pressure. What increases the risk? Some lifestyle factors can make you more likely to develop high blood pressure: Smoking. Not getting enough exercise or physical activity. Being overweight. Having too much fat, sugar, calories, or salt (sodium) in your diet. Drinking too much alcohol. Other risk factors include: Having any of these conditions: Heart disease. Diabetes. High cholesterol. Kidney disease. Obstructive sleep apnea. Having a family history of high blood pressure and high cholesterol. Age. The risk increases with age. Stress. What are the signs or symptoms? High blood pressure may not cause symptoms. Very high blood pressure (hypertensive crisis) may cause: Headache. Fast or uneven heartbeats (palpitations). Shortness of breath. Nosebleed. Vomiting or feeling like you may vomit (nauseous). Changes in how you see. Very bad chest pain. Feeling dizzy. Seizures. How is this treated? This condition is treated by making healthy lifestyle changes, such as: Eating healthy foods. Exercising more. Drinking less alcohol. Your doctor may prescribe medicine if lifestyle changes do not help enough and if: Your top number is above 130. Your bottom number is above 80. Your personal target blood pressure may vary. Follow these instructions at home: Eating and drinking  If told, follow the DASH eating plan. To follow this plan: Fill one half of your plate at each meal with fruits and vegetables. Fill one fourth of your plate  at each meal with whole grains. Whole grains include whole-wheat pasta, brown rice, and whole-grain bread. Eat or drink low-fat dairy products, such as skim milk or low-fat yogurt. Fill one fourth of your plate at each meal with low-fat (lean) proteins. Low-fat proteins include fish, chicken without skin, eggs, beans, and tofu. Avoid fatty meat, cured and processed meat, or chicken with skin. Avoid pre-made or processed food. Limit the amount of salt in your diet to less than 1,500 mg each day. Do not drink alcohol if: Your doctor tells you not to drink. You are pregnant, may be pregnant, or are planning to become pregnant. If you drink alcohol: Limit how much you have to: 0-1 drink a day for women. 0-2 drinks a day for men. Know how much alcohol is in your drink. In the [place] one drink equals one 12 oz bottle of beer (355 mL), one 5 oz glass of wine (148 mL), or one 1 oz glass of hard liquor (44 mL). Lifestyle  Work with your doctor to stay at a healthy weight or to lose weight. Ask your doctor what the best weight is for you. Get at least 30 minutes of exercise that causes your heart to beat faster (aerobic exercise) most days of the week. This may include walking, swimming, or biking. Get at least 30 minutes of exercise that strengthens your muscles (resistance exercise) at least 3 days a week. This may include lifting weights or doing Pilates. Do not smoke or use any products that contain nicotine or tobacco. If you need help quitting, ask your doctor. Check your blood pressure at home as told by your doctor. Keep all follow-up visits. Medicines Take over-the-counter and prescription medicines   only as told by your doctor. Follow directions carefully. Do not skip doses of blood pressure medicine. The medicine does not work as well if you skip doses. Skipping doses also puts you at risk for problems. Ask your doctor about side effects or reactions to medicines that you should watch  for. Contact a doctor if: You think you are having a reaction to the medicine you are taking. You have headaches that keep coming back. You feel dizzy. You have swelling in your ankles. You have trouble with your vision. Get help right away if: You get a very bad headache. You start to feel mixed up (confused). You feel weak or numb. You feel faint. You have very bad pain in your: Chest. Belly (abdomen). You vomit more than once. You have trouble breathing. These symptoms may be an emergency. Get help right away. Call 911. Do not wait to see if the symptoms will go away. Do not drive yourself to the hospital. Summary Hypertension is another name for high blood pressure. High blood pressure forces your heart to work harder to pump blood. For most people, a normal blood pressure is less than 120/80. Making healthy choices can help lower blood pressure. If your blood pressure does not get lower with healthy choices, you may need to take medicine. This information is not intended to replace advice given to you by your health care provider. Make sure you discuss any questions you have with your health care provider. Document Revised: 08/10/2021 Document Reviewed: 08/10/2021 Elsevier Patient Education  2024 Elsevier Inc.  

## 2023-06-12 ENCOUNTER — Other Ambulatory Visit: Payer: 59

## 2023-06-18 ENCOUNTER — Ambulatory Visit: Payer: 59

## 2023-06-18 VITALS — BP 126/80 | HR 60 | Temp 98.5°F | Ht 76.0 in | Wt 203.0 lb

## 2023-06-18 DIAGNOSIS — I10 Essential (primary) hypertension: Secondary | ICD-10-CM

## 2023-06-18 NOTE — Progress Notes (Signed)
I, T , CMA,acting as a Neurosurgeon for [employer] documented all relevant documentation on the behalf of [name],as directed by  [facility] while in the presence of [name].  Subjective:  Patient ID: Garrett Jones , male    DOB: Mar 17, 1976 , 47 y.o.   MRN: [id number]  No chief complaint on file.   HPI  HPI   Past Medical History:  Diagnosis Date   Chest pain 06/05/2021   COVID-19 virus infection 07/28/2020   Fatigue 10/21/2019   Urinary hesitancy 10/21/2019   Vitamin D deficiency      Family History  Problem Relation Age of Onset   Allergies Mother    Heart disease Father    Hypertension Father    Heart attack Father        X 3 prior to death due to covid   Prostate cancer Maternal Grandfather    Colon cancer Neg Hx    Esophageal cancer Neg Hx    Rectal cancer Neg Hx    Stomach cancer Neg Hx      Current Outpatient Medications:    amLODipine (NORVASC) 5 MG tablet, Take 1 tablet (5 mg total) by mouth daily for 180 doses., Disp: 90 tablet, Rfl: 1   Vitamin D, Ergocalciferol, (DRISDOL) 1.25 MG (50000 UNIT) CAPS capsule, Take 1 capsule (50,000 Units total) by mouth every 7 (seven) days. (Patient not taking: Reported on 05/22/2023), Disp: 12 capsule, Rfl: 1   No Known Allergies   Review of Systems   There were no vitals filed for this visit. There is no height or weight on file to calculate BMI.  Wt Readings from Last 3 Encounters:  06/07/23 203 lb (92.1 kg)  05/22/23 203 lb (92.1 kg)  11/26/22 199 lb (90.3 kg)     Objective:  Physical Exam      Assessment And Plan:  There are no diagnoses linked to this encounter.   No follow-ups on file.  Patient was given opportunity to ask questions. Patient verbalized understanding of the plan and was able to repeat key elements of the plan. All questions were answered to their satisfaction.  [name]  I, [name], have reviewed all documentation for this visit. The documentation on 06/18/23 for  the exam, diagnosis, procedures, and orders are all accurate and complete.   IF YOU HAVE BEEN REFERRED TO A SPECIALIST, IT MAY TAKE 1-2 WEEKS TO SCHEDULE/PROCESS THE REFERRAL. IF YOU HAVE NOT HEARD FROM US/SPECIALIST IN TWO WEEKS, PLEASE GIVE [place] A CALL AT [phone].   THE PATIENT IS ENCOURAGED TO PRACTICE SOCIAL DISTANCING DUE TO THE COVID-19 PANDEMIC.

## 2023-06-18 NOTE — Progress Notes (Signed)
Patient presents today for a BP check, patient currently taking amLODipine 5mg .  BP Readings from Last 3 Encounters:  06/18/23 (!) 140/88  06/07/23 (!) 151/101  05/22/23 (!) 140/84  From provider- take medication daily, follow up next appt.

## 2023-08-08 IMAGING — CT CT HEART MORP W/ CTA COR W/ SCORE W/ CA W/CM &/OR W/O CM
4 of 7 series · 8 of 20 positions shown, 9 images · IV contrast (APPLIED)
Comparison: None.
COMPARISON: None.

Addendum:
EXAM:
OVER-READ INTERPRETATION  CT CHEST

The following report is an over-read performed by radiologist Dr.
Angi Billiot [REDACTED] on 06/29/2021. This
over-read does not include interpretation of cardiac or coronary
anatomy or pathology. The coronary calcium score/coronary CTA
interpretation by the cardiologist is attached.
CLINICAL DATA: 45M with family history of premature CAD and chest
pain.
Cardiac/Coronary  CT
TECHNIQUE: The patient was scanned on a [employer] Force scanner.

[Series 6: ts diast sharp · axial · 0.39mm/px · z∈[+1382,+1426]mm · 2 of 338 slices shown]
[im 113/338  lung]
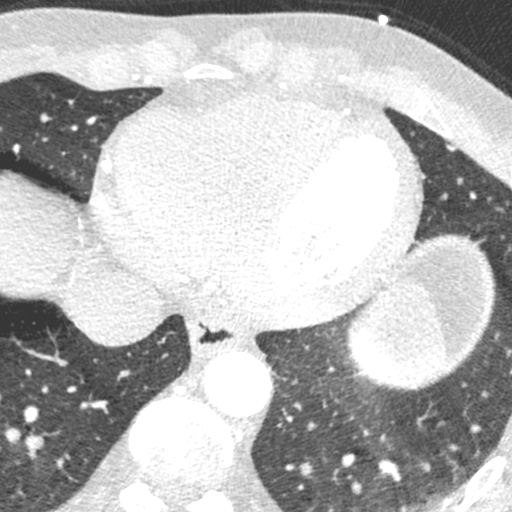
[im 225/338  lung]
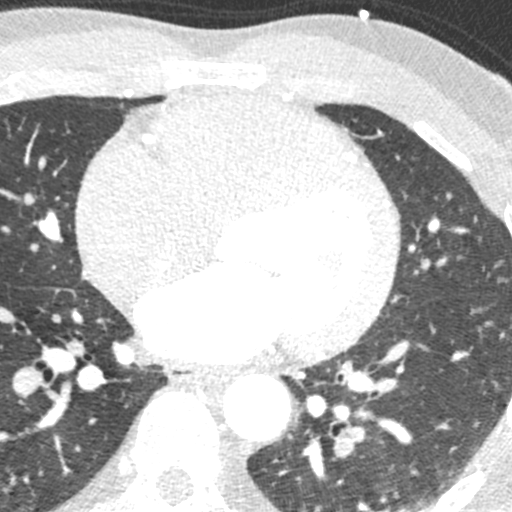

[Series 7: best diast · axial · 0.39mm/px · z∈[+1382,+1426]mm · 2 of 338 slices shown, 3 images]
[im 113/338  vessel]
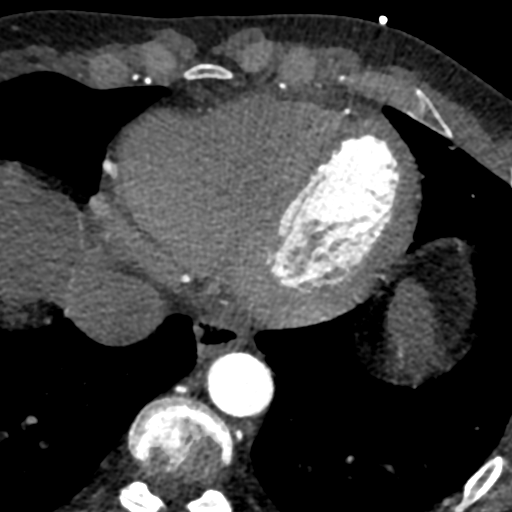
[im 113/338  lung]
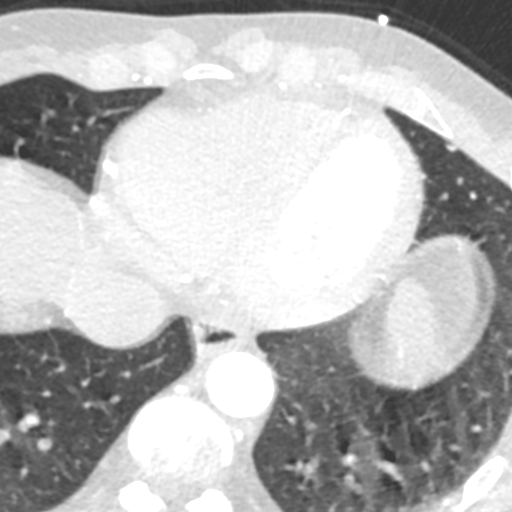
[im 225/338  vessel]
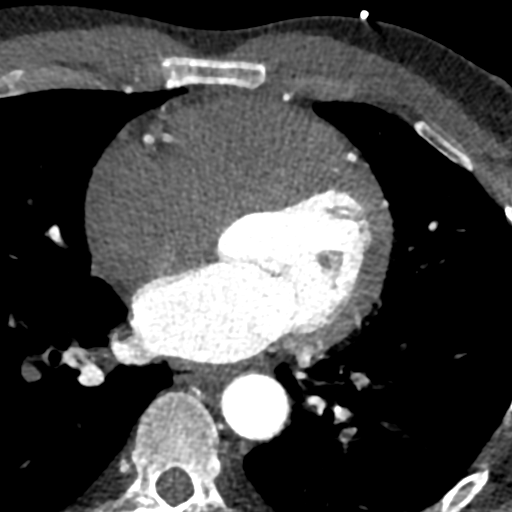

[Series 8: best syst · axial · 0.39mm/px · z∈[+1382,+1426]mm · 2 of 338 slices shown]
[im 113/338  vessel]
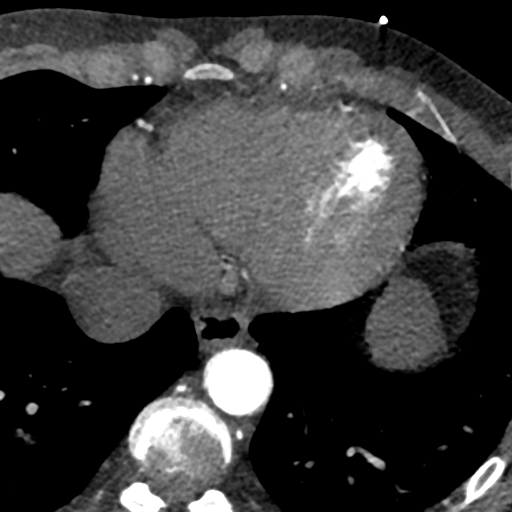
[im 225/338  vessel]
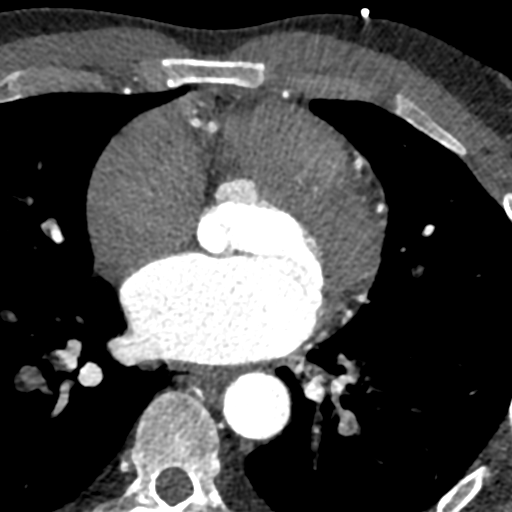

[Series 9: ts syst sharp · axial · 0.39mm/px · z∈[+1382,+1426]mm · 2 of 338 slices shown]
[im 113/338  lung]
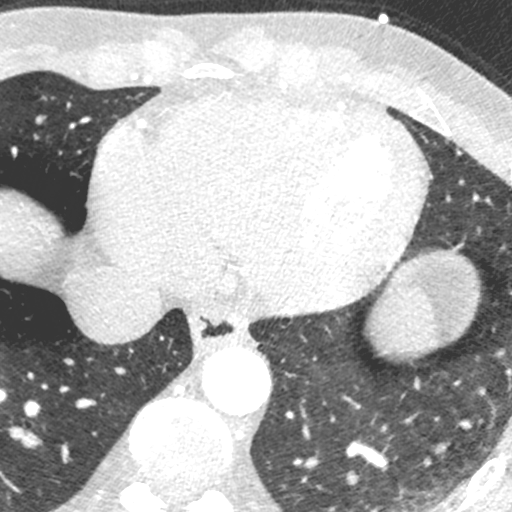
[im 225/338  lung]
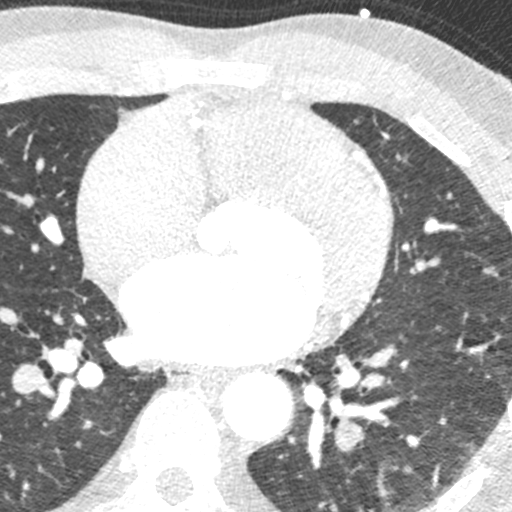

[8 of 20 positions shown; findings below may reference images not displayed]

FINDINGS: Vascular: None.

Mediastinum/Nodes: None.

Lungs/Pleura: Visualized lungs are clear.

Upper Abdomen: None.

Musculoskeletal: None.
IMPRESSION: Normal extracardiac structures.
FINDINGS: A 120 kV prospective scan was triggered in the descending thoracic
aorta at 111 HU's. Axial non-contrast 3 mm slices were carried out
through the heart. The data set was analyzed on a dedicated work
station and scored using the Agatson method. Gantry rotation speed
was 250 msecs and collimation was .6 mm. No beta blockade and 0.8 mg
of sl NTG was given. The 3D data set was reconstructed in 5%
intervals of the 67-82 % of the R-R cycle. Diastolic phases were
analyzed on a dedicated work station using MPR, MIP and VRT modes.
The patient received 80 cc of contrast.

Aorta: Normal size. Ascending aorta 3.1 cm. No calcifications. No
dissection.

Aortic Valve:  Trileaflet.  No calcifications.

Coronary Arteries:  Normal coronary origin.  Right dominance.

RCA is a large dominant artery that gives rise to PDA and PLVB.
There is no plaque.

Left main is a large artery that gives rise to LAD and LCX arteries.

LAD is a large vessel that has no plaque.  D1 has no plaque.

LCX is a non-dominant artery that gives rise to three OM branches.
There is no plaque.

Coronary Calcium Score: 0

Other findings:

Normal pulmonary vein drainage into the left atrium.

Normal let atrial appendage without a thrombus.

Normal size of the pulmonary artery.
IMPRESSION: 1. Coronary calcium score of 0. This was 0 percentile for age-,
race-, and sex-matched controls.

2. Normal coronary origin with right dominance.

3. No evidence of CAD.  CAD RADS 0.

*** End of Addendum ***
EXAM:
OVER-READ INTERPRETATION  CT CHEST

The following report is an over-read performed by radiologist Dr.
Angi Billiot [REDACTED] on 06/29/2021. This
over-read does not include interpretation of cardiac or coronary
anatomy or pathology. The coronary calcium score/coronary CTA
interpretation by the cardiologist is attached.
FINDINGS: Vascular: None.

Mediastinum/Nodes: None.

Lungs/Pleura: Visualized lungs are clear.

Upper Abdomen: None.

Musculoskeletal: None.
IMPRESSION: Normal extracardiac structures.

## 2023-08-09 ENCOUNTER — Encounter: Payer: Self-pay | Admitting: Nurse Practitioner

## 2023-08-15 ENCOUNTER — Other Ambulatory Visit: Payer: Self-pay

## 2023-08-15 MED ORDER — TADALAFIL 10 MG PO TABS: 10.0000 mg | ORAL_TABLET | ORAL | 1 refills | Status: AC | PRN

## 2023-08-27 NOTE — Progress Notes (Unsigned)
Madelaine Bhat, CMA,acting as a Neurosurgeon for [name], FNP.,have documented all relevant documentation on the behalf of [name], FNP,as directed by  [name], FNP while in the presence of [name], FNP.  Subjective:  Patient ID: Garrett Jones , male    DOB: 06-18-1976 , 47 y.o.   MRN: [id number]  No chief complaint on file.   HPI  Patient presents today for a bp follow up, Patient reports compliance with medication. Patient denies any chest pain, SOB, or headaches. Patient has no concerns today.     Past Medical History:  Diagnosis Date  . Chest pain 06/05/2021  . COVID-19 virus infection 07/28/2020  . Fatigue 10/21/2019  . Urinary hesitancy 10/21/2019  . Vitamin D deficiency      Family History  Problem Relation Age of Onset  . Allergies Mother   . Heart disease Father   . Hypertension Father   . Heart attack Father        X 3 prior to death due to covid  . Prostate cancer Maternal Grandfather   . Colon cancer Neg Hx   . Esophageal cancer Neg Hx   . Rectal cancer Neg Hx   . Stomach cancer Neg Hx      Current Outpatient Medications:  .  tadalafil (CIALIS) 10 MG tablet, Take 1 tablet (10 mg total) by mouth as needed for erectile dysfunction., Disp: 20 tablet, Rfl: 1 .  amLODipine (NORVASC) 5 MG tablet, Take 1 tablet (5 mg total) by mouth daily for 180 doses., Disp: 90 tablet, Rfl: 1 .  Vitamin D, Ergocalciferol, (DRISDOL) 1.25 MG (50000 UNIT) CAPS capsule, Take 1 capsule (50,000 Units total) by mouth every 7 (seven) days. (Patient not taking: Reported on 05/22/2023), Disp: 12 capsule, Rfl: 1   No Known Allergies   Review of Systems   There were no vitals filed for this visit. There is no height or weight on file to calculate BMI.  Wt Readings from Last 3 Encounters:  06/18/23 203 lb (92.1 kg)  06/07/23 203 lb (92.1 kg)  05/22/23 203 lb (92.1 kg)    The 10-year ASCVD risk score (Arnett DK, et al., 2019) is: 6.7%   Values used to calculate the score:      Age: 23 years     Sex: Male     Is Non-Hispanic African American: Yes     Diabetic: No     Tobacco smoker: No     Systolic Blood Pressure: 126 mmHg     Is BP treated: Yes     HDL Cholesterol: 63 mg/dL     Total Cholesterol: 158 mg/dL  Objective:  Physical Exam      Assessment And Plan:  Uncontrolled hypertension    No follow-ups on file.  Patient was given opportunity to ask questions. Patient verbalized understanding of the plan and was able to repeat key elements of the plan. All questions were answered to their satisfaction.    [name], FNP, have reviewed all documentation for this visit. The documentation on 08/27/23 for the exam, diagnosis, procedures, and orders are all accurate and complete.   IF YOU HAVE BEEN REFERRED TO A SPECIALIST, IT MAY TAKE 1-2 WEEKS TO SCHEDULE/PROCESS THE REFERRAL. IF YOU HAVE NOT HEARD FROM US/SPECIALIST IN TWO WEEKS, PLEASE GIVE [place] A CALL AT [phone].

## 2023-08-28 ENCOUNTER — Ambulatory Visit (INDEPENDENT_AMBULATORY_CARE_PROVIDER_SITE_OTHER): Payer: 59 | Admitting: Nurse Practitioner

## 2023-08-28 VITALS — BP 140/82 | HR 66 | Temp 98.5°F | Ht 76.0 in | Wt 197.4 lb

## 2023-08-28 DIAGNOSIS — I10 Essential (primary) hypertension: Secondary | ICD-10-CM | POA: Diagnosis not present

## 2023-08-28 DIAGNOSIS — E559 Vitamin D deficiency, unspecified: Secondary | ICD-10-CM | POA: Diagnosis not present

## 2023-08-28 DIAGNOSIS — Z2821 Immunization not carried out because of patient refusal: Secondary | ICD-10-CM | POA: Diagnosis not present

## 2023-08-28 MED ORDER — SPIRONOLACTONE 25 MG PO TABS
25.0000 mg | ORAL_TABLET | Freq: Every day | ORAL | 11 refills | Status: DC
Start: 2023-08-28 — End: 2023-08-28

## 2023-08-28 MED ORDER — SPIRONOLACTONE 25 MG PO TABS
12.5000 mg | ORAL_TABLET | Freq: Every day | ORAL | 1 refills | Status: DC
Start: 2023-08-28 — End: 2024-07-13

## 2023-08-28 MED ORDER — VITAMIN D (ERGOCALCIFEROL) 1.25 MG (50000 UNIT) PO CAPS
50000.0000 [IU] | ORAL_CAPSULE | ORAL | 1 refills | Status: DC
Start: 2023-08-28 — End: 2024-07-20

## 2023-08-28 NOTE — Assessment & Plan Note (Signed)
Blood pressure remains elevated with repeat. I am adding spironolactone 12.5 mg to take daily Advised to be sure to stay well hydrated with water. RTO in 2-3 weeks for repeat NV BP and BMP with eGFR

## 2023-08-28 NOTE — Assessment & Plan Note (Signed)

## 2023-08-28 NOTE — Assessment & Plan Note (Signed)
Will check vitamin D level and supplement as needed.    Also encouraged to spend 15 minutes in the sun daily.   

## 2023-08-28 NOTE — Assessment & Plan Note (Signed)

## 2023-08-29 LAB — BASIC METABOLIC PANEL
BUN/Creatinine Ratio: 13 (ref 9–20)
BUN: 14 mg/dL (ref 6–24)
CO2: 25 mmol/L (ref 20–29)
Calcium: 9.3 mg/dL (ref 8.7–10.2)
Chloride: 104 mmol/L (ref 96–106)
Creatinine, Ser: 1.09 mg/dL (ref 0.76–1.27)
Glucose: 95 mg/dL (ref 70–99)
Potassium: 4.5 mmol/L (ref 3.5–5.2)
Sodium: 140 mmol/L (ref 134–144)
eGFR: 84 mL/min/{1.73_m2} (ref >59)

## 2023-09-10 ENCOUNTER — Ambulatory Visit: Payer: 59

## 2023-09-10 VITALS — BP 110/80 | HR 63 | Temp 98.5°F | Ht 76.0 in | Wt 197.0 lb

## 2023-09-10 DIAGNOSIS — I10 Essential (primary) hypertension: Secondary | ICD-10-CM

## 2023-09-10 NOTE — Progress Notes (Signed)
Patient presents today for a BP check, patient currently taking amLODipine 5mg  and spironolactone 25mg . Patient reports he has been taking his medicine. BP Readings from Last 3 Encounters:  09/10/23 110/80  08/28/23 (!) 140/82  06/18/23 126/80   Per provider- great this is working he needs to continue the medications, tell him DO NOT stop taking  f/u next visit.

## 2023-11-19 ENCOUNTER — Other Ambulatory Visit: Payer: Self-pay | Admitting: Nurse Practitioner

## 2023-11-19 DIAGNOSIS — I10 Essential (primary) hypertension: Secondary | ICD-10-CM

## 2023-12-02 ENCOUNTER — Encounter: Payer: 59 | Admitting: Nurse Practitioner

## 2023-12-03 NOTE — Progress Notes (Signed)
Madelaine Bhat, CMA,acting as a Neurosurgeon for [name], FNP.,have documented all relevant documentation on the behalf of [name], FNP,as directed by  [name], FNP while in the presence of [name], FNP.  Subjective:   Patient ID: Garrett Jones , male    DOB: [birth year] , 48 y.o.   MRN: [id number]  Chief Complaint  Patient presents with   Annual Exam    HPI  Patient presents today for HM, Patient reports compliance with medication. Patient denies any chest pain, SOB, or headaches. Patient has no concerns today. Blood pressure readings at home he reports are better has been 120/70. He has taken his medications this morning, he just got off work this morning.      Past Medical History:  Diagnosis Date   Anxiety    Chest pain 06/05/2021   COVID-19 virus infection 07/28/2020   Depression    Fatigue 10/21/2019   Urinary hesitancy 10/21/2019   Vitamin D deficiency      Family History  Problem Relation Age of Onset   Allergies Mother    Heart disease Father    Hypertension Father    Heart attack Father        X 3 prior to death due to covid   Prostate cancer Maternal Grandfather    Colon cancer Neg Hx    Esophageal cancer Neg Hx    Rectal cancer Neg Hx    Stomach cancer Neg Hx      Current Outpatient Medications:    amLODipine (NORVASC) 5 MG tablet, TAKE 1 TABLET(5 MG) BY MOUTH DAILY FOR 180 DOSES, Disp: 90 tablet, Rfl: 1   spironolactone (ALDACTONE) 25 MG tablet, Take 0.5 tablets (12.5 mg total) by mouth daily., Disp: 45 tablet, Rfl: 1   tadalafil (CIALIS) 10 MG tablet, Take 1 tablet (10 mg total) by mouth as needed for erectile dysfunction., Disp: 20 tablet, Rfl: 1   Vitamin D, Ergocalciferol, (DRISDOL) 1.25 MG (50000 UNIT) CAPS capsule, Take 1 capsule (50,000 Units total) by mouth every 7 (seven) days., Disp: 12 capsule, Rfl: 1   No Known Allergies   Men's preventive visit. Patient Health Questionnaire (PHQ-2) is  Flowsheet Row Office Visit from 12/04/2023  in [facility]  PHQ-2 Total Score 0     Patient is on a Regular diet. Exercise - not much - plans to get back more active [facility] [employer] [facility]. Marital status: Significant Other. Relevant history for alcohol use is:  Social History   Substance and Sexual Activity  Alcohol Use Not Currently   Alcohol/week: 3.0 standard drinks of alcohol   Types: 3 Cans of beer per week   Comment: ocassionally  . Relevant history for tobacco use is:  Social History   Tobacco Use  Smoking Status Never  Smokeless Tobacco Never  .   Review of Systems  Constitutional: Negative.   HENT: Negative.    Eyes: Negative.   Respiratory: Negative.    Cardiovascular: Negative.   Gastrointestinal: Negative.   Endocrine: Negative.   Genitourinary: Negative.   Musculoskeletal: Negative.   Skin: Negative.   Allergic/Immunologic: Negative.   Neurological: Negative.   Hematological: Negative.   Psychiatric/Behavioral: Negative.       Today's Vitals   12/04/23 1104 12/04/23 1117  BP: (!) 130/90 (!) 130/92  Pulse: 60   Temp: 98.7 F (37.1 C)   TempSrc: Oral   Weight: 197 lb (89.4 kg)   Height: 6'  4" (1.93 m)   PainSc: 0-No pain    Body mass index is 23.98 kg/m.  Wt Readings from Last 3 Encounters:  12/04/23 197 lb (89.4 kg)  09/10/23 197 lb (89.4 kg)  08/28/23 197 lb 6.4 oz (89.5 kg)    Objective:  Physical Exam Vitals reviewed.  Constitutional:      General: He is not in acute distress.    Appearance: Normal appearance.  HENT:     Head: Normocephalic and atraumatic.     Right Ear: Tympanic membrane, ear canal and external ear normal. There is no impacted cerumen.     Left Ear: Tympanic membrane, ear canal and external ear normal. There is no impacted cerumen.     Nose: Nose normal.     Mouth/Throat:     Mouth: Mucous membranes are moist.  Eyes:     Pupils: Pupils are equal, round, and reactive to light.  Cardiovascular:      Rate and Rhythm: Normal rate and regular rhythm.     Pulses: Normal pulses.     Heart sounds: Normal heart sounds. No murmur heard. Pulmonary:     Effort: Pulmonary effort is normal. No respiratory distress.     Breath sounds: Normal breath sounds. No wheezing.  Abdominal:     General: Abdomen is flat. Bowel sounds are normal. There is no distension.     Palpations: Abdomen is soft.     Tenderness: There is no abdominal tenderness.  Genitourinary:    Penis: Normal.      Prostate: Normal.     Rectum: Guaiac result negative.  Musculoskeletal:        General: No swelling or tenderness. Normal range of motion.     Cervical back: Normal range of motion and neck supple. No rigidity.  Skin:    General: Skin is warm.     Capillary Refill: Capillary refill takes less than 2 seconds.  Neurological:     General: No focal deficit present.     Mental Status: He is alert and oriented to person, place, and time.     Cranial Nerves: No cranial nerve deficit.     Motor: No weakness.  Psychiatric:        Mood and Affect: Mood normal.        Behavior: Behavior normal.        Thought Content: Thought content normal.        Judgment: Judgment normal.         Assessment And Plan:    Encounter for annual health examination Assessment & Plan: Behavior modifications discussed and diet history reviewed.   Pt will continue to exercise regularly and modify diet with low GI, plant based foods and decrease intake of processed foods.  Recommend intake of daily multivitamin, Vitamin D, and calcium.  Recommend colonoscopy for preventive screenings, as well as recommend immunizations that include influenza, TDAP    Uncontrolled hypertension Assessment & Plan: Blood pressure remains slightly elevated, repeat was slightly increased. He has just got off work. EKG done with   Orders: -     EKG 12-Lead -     POCT URINALYSIS DIP (CLINITEK) -     Microalbumin / creatinine urine ratio -      CMP14+EGFR  Vitamin D deficiency Assessment & Plan: Will check vitamin D level and supplement as needed.    Also encouraged to spend 15 minutes in the sun daily.    Orders: -     VITAMIN D 25 Hydroxy (Vit-D  Deficiency, Fractures)  Mixed hyperlipidemia Assessment & Plan: Cholesterol levels are normal with last visit.   Orders: -     Lipid panel  COVID-19 vaccination declined Assessment & Plan: Declines covid 19 vaccine. Discussed risk of covid 53 and if he changes her mind about the vaccine to call the office. Education has been provided regarding the importance of this vaccine but patient still declined. Advised may receive this vaccine at local pharmacy or Health Dept.or vaccine clinic. Aware to provide a copy of the vaccination record if obtained from local pharmacy or Health Dept.  Encouraged to take multivitamin, vitamin d, vitamin c and zinc to increase immune system. Aware can call office if would like to have vaccine here at office. Verbalized acceptance and understanding.     Other long term (current) drug therapy -     CBC with Differential/Platelet  Encounter for screening for metabolic disorder -     Hemoglobin A1c  Encounter for prostate cancer screening -     PSA     Return for 1 year physical, uncontrolled bp 3-37months check. Patient was given opportunity to ask questions. Patient verbalized understanding of the plan and was able to repeat key elements of the plan. All questions were answered to their satisfaction.   [name], FNP  I, [name], FNP, have reviewed all documentation for this visit. The documentation on 12/04/23 for the exam, diagnosis, procedures, and orders are all accurate and complete.

## 2023-12-04 ENCOUNTER — Encounter: Payer: Self-pay | Admitting: Nurse Practitioner

## 2023-12-04 ENCOUNTER — Ambulatory Visit (INDEPENDENT_AMBULATORY_CARE_PROVIDER_SITE_OTHER): Payer: 59 | Admitting: Nurse Practitioner

## 2023-12-04 VITALS — BP 130/92 | HR 60 | Temp 98.7°F | Ht 76.0 in | Wt 197.0 lb

## 2023-12-04 DIAGNOSIS — Z79899 Other long term (current) drug therapy: Secondary | ICD-10-CM

## 2023-12-04 DIAGNOSIS — E782 Mixed hyperlipidemia: Secondary | ICD-10-CM | POA: Diagnosis not present

## 2023-12-04 DIAGNOSIS — I10 Essential (primary) hypertension: Secondary | ICD-10-CM | POA: Diagnosis not present

## 2023-12-04 DIAGNOSIS — E559 Vitamin D deficiency, unspecified: Secondary | ICD-10-CM | POA: Diagnosis not present

## 2023-12-04 DIAGNOSIS — Z0000 Encounter for general adult medical examination without abnormal findings: Secondary | ICD-10-CM

## 2023-12-04 DIAGNOSIS — Z2821 Immunization not carried out because of patient refusal: Secondary | ICD-10-CM

## 2023-12-04 DIAGNOSIS — Z13228 Encounter for screening for other metabolic disorders: Secondary | ICD-10-CM

## 2023-12-04 DIAGNOSIS — Z125 Encounter for screening for malignant neoplasm of prostate: Secondary | ICD-10-CM

## 2023-12-04 LAB — POCT URINALYSIS DIP (CLINITEK)
Bilirubin, UA: NEGATIVE
Blood, UA: NEGATIVE
Glucose, UA: NEGATIVE mg/dL
Ketones, POC UA: NEGATIVE mg/dL
Leukocytes, UA: NEGATIVE
Nitrite, UA: NEGATIVE
POC PROTEIN,UA: NEGATIVE
Spec Grav, UA: 1.02 (ref 1.010–1.025)
Urobilinogen, UA: 1 U/dL
pH, UA: 8.5 — AB (ref 5.0–8.0)

## 2023-12-04 NOTE — Assessment & Plan Note (Signed)
Behavior modifications discussed and diet history reviewed.   Pt will continue to exercise regularly and modify diet with low GI, plant based foods and decrease intake of processed foods.  Recommend intake of daily multivitamin, Vitamin D, and calcium.  Recommend colonoscopy for preventive screenings, as well as recommend immunizations that include influenza, TDAP

## 2023-12-04 NOTE — Assessment & Plan Note (Signed)
Will check vitamin D level and supplement as needed.    Also encouraged to spend 15 minutes in the sun daily.

## 2023-12-04 NOTE — Assessment & Plan Note (Signed)
Cholesterol levels are normal with last visit.

## 2023-12-04 NOTE — Assessment & Plan Note (Signed)
Declines covid 19 vaccine. Discussed risk of covid 63 and if he changes her mind about the vaccine to call the office. Education has been provided regarding the importance of this vaccine but patient still declined. Advised may receive this vaccine at local pharmacy or Health Dept.or vaccine clinic. Aware to provide a copy of the vaccination record if obtained from local pharmacy or Health Dept.  Encouraged to take multivitamin, vitamin d, vitamin c and zinc to increase immune system. Aware can call office if would like to have vaccine here at office. Verbalized acceptance and understanding.

## 2023-12-04 NOTE — Assessment & Plan Note (Signed)
Blood pressure remains slightly elevated, repeat was slightly increased. He has just got off work. EKG done with

## 2023-12-05 LAB — CBC WITH DIFFERENTIAL/PLATELET
Basophils Absolute: 0 10*3/uL (ref 0.0–0.2)
Basos: 0 %
EOS (ABSOLUTE): 0.3 10*3/uL (ref 0.0–0.4)
Eos: 6 %
Hematocrit: 43.1 % (ref 37.5–51.0)
Hemoglobin: 14.7 g/dL (ref 13.0–17.7)
Immature Grans (Abs): 0 10*3/uL (ref 0.0–0.1)
Immature Granulocytes: 0 %
Lymphocytes Absolute: 1.9 10*3/uL (ref 0.7–3.1)
Lymphs: 36 %
MCH: 28.5 pg (ref 26.6–33.0)
MCHC: 34.1 g/dL (ref 31.5–35.7)
MCV: 84 fL (ref 79–97)
Monocytes Absolute: 0.5 10*3/uL (ref 0.1–0.9)
Monocytes: 10 %
Neutrophils Absolute: 2.6 10*3/uL (ref 1.4–7.0)
Neutrophils: 48 %
Platelets: 134 10*3/uL — ABNORMAL LOW (ref 150–450)
RBC: 5.16 x10E6/uL (ref 4.14–5.80)
RDW: 13.1 % (ref 11.6–15.4)
WBC: 5.4 10*3/uL (ref 3.4–10.8)

## 2023-12-05 LAB — MICROALBUMIN / CREATININE URINE RATIO
Creatinine, Urine: 121.4 mg/dL
Microalb/Creat Ratio: <2 mg/g{creat} (ref 0–29)
Microalbumin, Urine: <3 ug/mL

## 2023-12-05 LAB — CMP14+EGFR
ALT: 13 [IU]/L (ref 0–44)
AST: 19 [IU]/L (ref 0–40)
Albumin: 4.3 g/dL (ref 4.1–5.1)
Alkaline Phosphatase: 74 [IU]/L (ref 44–121)
BUN/Creatinine Ratio: 16 (ref 9–20)
BUN: 17 mg/dL (ref 6–24)
Bilirubin Total: 0.4 mg/dL (ref 0.0–1.2)
CO2: 22 mmol/L (ref 20–29)
Calcium: 9 mg/dL (ref 8.7–10.2)
Chloride: 104 mmol/L (ref 96–106)
Creatinine, Ser: 1.04 mg/dL (ref 0.76–1.27)
Globulin, Total: 2.2 g/dL (ref 1.5–4.5)
Glucose: 91 mg/dL (ref 70–99)
Potassium: 4 mmol/L (ref 3.5–5.2)
Sodium: 139 mmol/L (ref 134–144)
Total Protein: 6.5 g/dL (ref 6.0–8.5)
eGFR: 89 mL/min/{1.73_m2} (ref >59)

## 2023-12-05 LAB — LIPID PANEL
Chol/HDL Ratio: 3.2 {ratio} (ref 0.0–5.0)
Cholesterol, Total: 161 mg/dL (ref 100–199)
HDL: 51 mg/dL (ref >39)
LDL Chol Calc (NIH): 85 mg/dL (ref 0–99)
Triglycerides: 146 mg/dL (ref 0–149)
VLDL Cholesterol Cal: 25 mg/dL (ref 5–40)

## 2023-12-05 LAB — PSA: Prostate Specific Ag, Serum: 0.5 ng/mL (ref 0.0–4.0)

## 2023-12-05 LAB — VITAMIN D 25 HYDROXY (VIT D DEFICIENCY, FRACTURES): Vit D, 25-Hydroxy: 44 ng/mL (ref 30.0–100.0)

## 2023-12-05 LAB — HEMOGLOBIN A1C
Est. average glucose Bld gHb Est-mCnc: 114 mg/dL
Hgb A1c MFr Bld: 5.6 % (ref 4.8–5.6)

## 2023-12-08 ENCOUNTER — Encounter: Payer: Self-pay | Admitting: Nurse Practitioner

## 2024-01-02 ENCOUNTER — Encounter: Payer: Self-pay | Admitting: Nurse Practitioner

## 2024-03-09 ENCOUNTER — Encounter: Payer: Self-pay | Admitting: Nurse Practitioner

## 2024-03-09 ENCOUNTER — Ambulatory Visit (INDEPENDENT_AMBULATORY_CARE_PROVIDER_SITE_OTHER): Payer: 59 | Admitting: Nurse Practitioner

## 2024-03-09 VITALS — BP 140/90 | HR 69 | Temp 98.5°F | Ht 76.0 in | Wt 200.4 lb

## 2024-03-09 DIAGNOSIS — I10 Essential (primary) hypertension: Secondary | ICD-10-CM

## 2024-03-09 DIAGNOSIS — R1012 Left upper quadrant pain: Secondary | ICD-10-CM

## 2024-03-09 DIAGNOSIS — E782 Mixed hyperlipidemia: Secondary | ICD-10-CM | POA: Diagnosis not present

## 2024-03-09 LAB — CMP14+EGFR
ALT: 10 IU/L (ref 0–44)
AST: 16 IU/L (ref 0–40)
Albumin: 4.2 g/dL (ref 4.1–5.1)
Alkaline Phosphatase: 71 IU/L (ref 44–121)
BUN/Creatinine Ratio: 10 (ref 9–20)
BUN: 12 mg/dL (ref 6–24)
Bilirubin Total: 0.5 mg/dL (ref 0.0–1.2)
CO2: 23 mmol/L (ref 20–29)
Calcium: 9.3 mg/dL (ref 8.7–10.2)
Chloride: 104 mmol/L (ref 96–106)
Creatinine, Ser: 1.22 mg/dL (ref 0.76–1.27)
Globulin, Total: 2.3 g/dL (ref 1.5–4.5)
Glucose: 82 mg/dL (ref 70–99)
Potassium: 4.2 mmol/L (ref 3.5–5.2)
Sodium: 140 mmol/L (ref 134–144)
Total Protein: 6.5 g/dL (ref 6.0–8.5)
eGFR: 73 mL/min/{1.73_m2} (ref >59)

## 2024-03-09 LAB — CBC WITH DIFFERENTIAL/PLATELET
Basophils Absolute: 0 10*3/uL (ref 0.0–0.2)
Basos: 1 %
EOS (ABSOLUTE): 0.2 10*3/uL (ref 0.0–0.4)
Eos: 6 %
Hematocrit: 46.4 % (ref 37.5–51.0)
Hemoglobin: 15.7 g/dL (ref 13.0–17.7)
Immature Grans (Abs): 0 10*3/uL (ref 0.0–0.1)
Immature Granulocytes: 0 %
Lymphocytes Absolute: 1.5 10*3/uL (ref 0.7–3.1)
Lymphs: 37 %
MCH: 29 pg (ref 26.6–33.0)
MCHC: 33.8 g/dL (ref 31.5–35.7)
MCV: 86 fL (ref 79–97)
Monocytes Absolute: 0.3 10*3/uL (ref 0.1–0.9)
Monocytes: 9 %
Neutrophils Absolute: 1.9 10*3/uL (ref 1.4–7.0)
Neutrophils: 47 %
Platelets: 133 10*3/uL — ABNORMAL LOW (ref 150–450)
RBC: 5.41 x10E6/uL (ref 4.14–5.80)
RDW: 13 % (ref 11.6–15.4)
WBC: 4 10*3/uL (ref 3.4–10.8)

## 2024-03-09 NOTE — Assessment & Plan Note (Signed)
 Blood pressure is increased but is improved at repeat. Discussed importance of compliance and to set a reminder on his phone

## 2024-03-09 NOTE — Assessment & Plan Note (Signed)
 Mild tenderness to left upper quadrant. Will reorder CT abdomen without contrast. Encouraged to make sure to have done as this is the second time I am placing the CT scan order

## 2024-03-09 NOTE — Assessment & Plan Note (Signed)
 Cholesterol levels are normal with last visit. Will not repeat today.

## 2024-03-09 NOTE — Progress Notes (Signed)
 [place], CMA,acting as a Neurosurgeon for Susanna Epley, FNP.,have documented all relevant documentation on the behalf of Susanna Epley, FNP,as directed by  Susanna Epley, FNP while in the presence of Susanna Epley, FNP.  Subjective:  Patient ID: Garrett Jones , male    DOB: [birth year] , 48 y.o.   MRN: [id number]  Chief Complaint  Patient presents with   Hypertension    Patient presents today for a bp follow up, Patient reports compliance with medication. Patient denies any chest pain, SOB, or headaches. Patient has no concerns today. Patient reports he misses his medication about 2 days a week because he forgets. He reports he didn't take it yesterday or this morning yet.    Flank Pain    Patient reports when he has a  drink containing alcohol  he has pain on his left side. He reports it comes and goes but last about 2 days.    HPI  His abdomen pain is a dull ache. I had placed a referral for a CT scan in July but he did not go. This has reoccurred and would like to have reordered.      Past Medical History:  Diagnosis Date   Anxiety    Chest pain 06/05/2021   COVID-19 virus infection 07/28/2020   Depression    Fatigue 10/21/2019   Hemorrhoids 10/21/2019   Urinary hesitancy 10/21/2019   Vitamin D  deficiency      Family History  Problem Relation Age of Onset   Allergies Mother    Heart disease Father    Hypertension Father    Heart attack Father        X 3 prior to death due to covid   Prostate cancer Maternal Grandfather    Colon cancer Neg Hx    Esophageal cancer Neg Hx    Rectal cancer Neg Hx    Stomach cancer Neg Hx      Current Outpatient Medications:    amLODipine  (NORVASC ) 5 MG tablet, TAKE 1 TABLET(5 MG) BY MOUTH DAILY FOR 180 DOSES, Disp: 90 tablet, Rfl: 1   spironolactone  (ALDACTONE ) 25 MG tablet, Take 0.5 tablets (12.5 mg total) by mouth daily., Disp: 45 tablet, Rfl: 1   tadalafil  (CIALIS ) 10 MG tablet, Take 1 tablet (10 mg total) by mouth as needed for  erectile dysfunction., Disp: 20 tablet, Rfl: 1   Vitamin D , Ergocalciferol , (DRISDOL ) 1.25 MG (50000 UNIT) CAPS capsule, Take 1 capsule (50,000 Units total) by mouth every 7 (seven) days., Disp: 12 capsule, Rfl: 1   No Known Allergies   Review of Systems  Constitutional: Negative.   Respiratory: Negative.    Cardiovascular:  Negative for chest pain, palpitations and leg swelling.  Gastrointestinal:  Negative for abdominal pain, constipation, diarrhea, nausea and vomiting.  Genitourinary:  Negative for frequency and hematuria.  Neurological: Negative.  Negative for headaches.  Psychiatric/Behavioral: Negative.       Today's Vitals   03/09/24 0827 03/09/24 0853  BP: (!) 150/90 (!) 140/90  Pulse: 69   Temp: 98.5 F (36.9 C)   TempSrc: Oral   Weight: 200 lb 6.4 oz (90.9 kg)   Height: 6\' 4"  (1.93 m)   PainSc: 0-No pain    Body mass index is 24.39 kg/m.  Wt Readings from Last 3 Encounters:  03/09/24 200 lb 6.4 oz (90.9 kg)  12/04/23 197 lb (89.4 kg)  09/10/23 197 lb (89.4 kg)      Objective:  Physical Exam Vitals and nursing note reviewed.  Constitutional:  General: He is not in acute distress.    Appearance: Normal appearance.  Cardiovascular:     Rate and Rhythm: Normal rate and regular rhythm.     Pulses: Normal pulses.     Heart sounds: Normal heart sounds. No murmur heard. Pulmonary:     Effort: Pulmonary effort is normal. No respiratory distress.     Breath sounds: Normal breath sounds. No wheezing.  Skin:    General: Skin is warm and dry.     Capillary Refill: Capillary refill takes less than 2 seconds.  Neurological:     General: No focal deficit present.     Mental Status: He is alert and oriented to person, place, and time.     Cranial Nerves: No cranial nerve deficit.     Motor: No weakness.  Psychiatric:        Mood and Affect: Mood normal.        Behavior: Behavior normal.        Thought Content: Thought content normal.        Judgment: Judgment  normal.     Assessment And Plan:  Uncontrolled hypertension Assessment & Plan: Blood pressure is increased but is improved at repeat. Discussed importance of compliance and to set a reminder on his phone  Orders: -     CMP14+EGFR  Mixed hyperlipidemia Assessment & Plan: Cholesterol levels are normal with last visit. Will not repeat today.   Orders: -     CMP14+EGFR  Left upper quadrant abdominal pain Assessment & Plan: Mild tenderness to left upper quadrant. Will reorder CT abdomen without contrast. Encouraged to make sure to have done as this is the second time I am placing the CT scan order   Orders: -     CT ABDOMEN PELVIS WO CONTRAST; Future -     CMP14+EGFR -     CBC with Differential/Platelet    Return for uncontrolled bp 3-47months check.  Patient was given opportunity to ask questions. Patient verbalized understanding of the plan and was able to repeat key elements of the plan. All questions were answered to their satisfaction.    Inge Mangle, FNP, have reviewed all documentation for this visit. The documentation on 03/09/24 for the exam, diagnosis, procedures, and orders are all accurate and complete.   IF YOU HAVE BEEN REFERRED TO A SPECIALIST, IT MAY TAKE 1-2 WEEKS TO SCHEDULE/PROCESS THE REFERRAL. IF YOU HAVE NOT HEARD FROM US /SPECIALIST IN TWO WEEKS, PLEASE GIVE US  A CALL AT [phone].

## 2024-03-13 ENCOUNTER — Encounter: Payer: Self-pay | Admitting: Nurse Practitioner

## 2024-03-16 ENCOUNTER — Ambulatory Visit
Admission: RE | Admit: 2024-03-16 | Discharge: 2024-03-16 | Disposition: A | Discharge status: Home / Self Care | Source: Ambulatory Visit | Attending: Nurse Practitioner | Admitting: Nurse Practitioner

## 2024-03-16 ENCOUNTER — Telehealth: Payer: Self-pay

## 2024-03-16 DIAGNOSIS — R1012 Left upper quadrant pain: Secondary | ICD-10-CM

## 2024-03-16 NOTE — Telephone Encounter (Signed)
 Copied from CRM [id number]. Topic: Referral - Prior Authorization Question >> Mar 13, 2024  9:00 AM Garrett Jones wrote: Reason for CRM: Garrett Jones calling from imaging to get the prior auth form faxed over to her. [phone] Garrett Jones the patient has appt sched for 05/12 need asap.

## 2024-03-17 ENCOUNTER — Ambulatory Visit: Payer: Self-pay | Admitting: Nurse Practitioner

## 2024-05-26 ENCOUNTER — Other Ambulatory Visit: Payer: Self-pay | Admitting: Nurse Practitioner

## 2024-05-26 DIAGNOSIS — I10 Essential (primary) hypertension: Secondary | ICD-10-CM

## 2024-07-13 ENCOUNTER — Encounter: Payer: Self-pay | Admitting: Nurse Practitioner

## 2024-07-13 ENCOUNTER — Ambulatory Visit: Admitting: Nurse Practitioner

## 2024-07-13 VITALS — BP 120/86 | HR 81 | Temp 98.5°F | Ht 76.0 in | Wt 200.6 lb

## 2024-07-13 DIAGNOSIS — E559 Vitamin D deficiency, unspecified: Secondary | ICD-10-CM | POA: Diagnosis not present

## 2024-07-13 DIAGNOSIS — Z2821 Immunization not carried out because of patient refusal: Secondary | ICD-10-CM | POA: Diagnosis not present

## 2024-07-13 DIAGNOSIS — Z139 Encounter for screening, unspecified: Secondary | ICD-10-CM | POA: Diagnosis not present

## 2024-07-13 DIAGNOSIS — I10 Essential (primary) hypertension: Secondary | ICD-10-CM | POA: Diagnosis not present

## 2024-07-13 MED ORDER — AMLODIPINE BESYLATE 5 MG PO TABS
5.0000 mg | ORAL_TABLET | Freq: Every day | ORAL | 1 refills | Status: AC
Start: 2024-07-13 — End: ?

## 2024-07-13 MED ORDER — SPIRONOLACTONE 25 MG PO TABS: 12.5000 mg | ORAL_TABLET | Freq: Every day | ORAL | 1 refills | Status: DC

## 2024-07-13 NOTE — Progress Notes (Signed)
 LILLETTE Kristeen JINNY Gladis, CMA,acting as a Neurosurgeon for Gaines Ada, FNP.,have documented all relevant documentation on the behalf of Gaines Ada, FNP,as directed by  Gaines Ada, FNP while in the presence of Gaines Ada, FNP.  Subjective:  Patient ID: Garrett Jones , male    DOB: 1976-02-12 , 48 y.o.   MRN: [id number]  Chief Complaint  Patient presents with   Hypertension    Patient presents today for a bp follow up, Patient reports compliance with medication. Patient denies any chest pain, SOB, or headaches. Patient reports he hasn't been taking his blood pressure medication, he has been trying to do it on his own. He has stopped for 2 weeks.       HPI  Discussed the use of AI scribe software for clinical note transcription with the patient, who gave verbal consent to proceed.  History of Present Illness Garrett Jones is a 49 year old male with hypertension who presents for a blood pressure follow-up.  He has been inconsistent with his blood pressure medication, leading to discontinuation. He has attempted to manage his blood pressure through dietary changes, including eliminating certain foods and incorporating grapefruit, which he believes has helped maintain his blood pressure around 120/86 to 120/90. However, he struggles to lower the diastolic number further.  He recently resumed going to the gym and using the sauna, which he feels has been beneficial. He wants to find a medication regimen that requires less frequent dosing.  He is currently prescribed amlodipine  5 mg daily and spironolactone  12.5 mg (25 mg tab), which he was taking as a whole tablet.  His family history includes his father having heart issues, and his sister was recently diagnosed with breast cancer, though it was caught early and she is undergoing treatment. He mentions a friend with significant health issues, including obesity and likely diabetes, which he uses as a cautionary example.  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 He works night shifts, which he finds challenging due to poor sleep quality, and he is looking forward to transitioning to day shifts in about a year. He is also focused on maintaining a healthy diet, avoiding processed foods, and is using supplements and various teas to support his health.   Past Medical History:  Diagnosis Date   Anxiety    Chest pain 06/05/2021   COVID-19 virus infection 07/28/2020   Depression    Fatigue 10/21/2019   Hemorrhoids 10/21/2019   Urinary hesitancy 10/21/2019   Vitamin D  deficiency      Family History  Problem Relation Age of Onset   Allergies Mother    Heart disease Father    Hypertension Father    Heart attack Father        X 3 prior to death due to covid   Prostate cancer Maternal Grandfather    Colon cancer Neg Hx    Esophageal cancer Neg Hx    Rectal cancer Neg Hx    Stomach cancer Neg Hx      Current Outpatient Medications:    tadalafil  (CIALIS ) 10 MG tablet, Take 1 tablet (10 mg total) by mouth as needed for erectile dysfunction., Disp: 20 tablet, Rfl: 1   amLODipine  (NORVASC ) 5 MG tablet, Take 1 tablet (5 mg total) by mouth daily., Disp: 90 tablet, Rfl: 1   spironolactone  (ALDACTONE ) 25 MG tablet, Take 0.5 tablets (12.5 mg total) by mouth daily., Disp: 45 tablet, Rfl: 1   Vitamin D , Ergocalciferol , (DRISDOL ) 1.25 MG (  50000 UNIT) CAPS capsule, Take 1 capsule (50,000 Units total) by mouth every 7 (seven) days., Disp: 12 capsule, Rfl: 1   No Known Allergies   Review of Systems  Constitutional: Negative.   Respiratory: Negative.    Cardiovascular:  Negative for chest pain, palpitations and leg swelling.  Gastrointestinal:  Negative for abdominal pain, constipation, diarrhea, nausea and vomiting.  Genitourinary:  Negative for frequency and hematuria.  Neurological: Negative.  Negative for headaches.  Psychiatric/Behavioral: Negative.       Today's Vitals   07/13/24 0844 07/13/24  1003  BP: (!) 120/90 120/86  Pulse: 81   Temp: 98.5 F (36.9 C)   TempSrc: Oral   Weight: 200 lb 9.6 oz (91 kg)   Height: 6' 4 (1.93 m)   PainSc: 0-No pain    Body mass index is 24.42 kg/m.  Wt Readings from Last 3 Encounters:  07/13/24 200 lb 9.6 oz (91 kg)  03/09/24 200 lb 6.4 oz (90.9 kg)  12/04/23 197 lb (89.4 kg)      Objective:  Physical Exam Vitals and nursing note reviewed.  Constitutional:      General: He is not in acute distress.    Appearance: Normal appearance.  Cardiovascular:     Rate and Rhythm: Normal rate and regular rhythm.     Pulses: Normal pulses.     Heart sounds: Normal heart sounds. No murmur heard. Pulmonary:     Effort: Pulmonary effort is normal. No respiratory distress.     Breath sounds: Normal breath sounds. No wheezing.  Skin:    General: Skin is warm and dry.     Capillary Refill: Capillary refill takes less than 2 seconds.  Neurological:     General: No focal deficit present.     Mental Status: He is alert and oriented to person, place, and time.     Cranial Nerves: No cranial nerve deficit.     Motor: No weakness.  Psychiatric:        Mood and Affect: Mood normal.        Behavior: Behavior normal.        Thought Content: Thought content normal.        Judgment: Judgment normal.      Assessment And Plan:  Essential hypertension Assessment & Plan: Uncontrolled hypertension with inconsistent medication adherence. Blood pressure improved with lifestyle changes, but diastolic pressure remains slightly elevated. Discussed importance of controlling blood pressure to prevent cardiovascular complications. - Take half a tablet of spironolactone  daily (12.5 mg) - Continue taking 5 mg of amlodipine  daily. - Continue lifestyle modifications, including diet and exercise. - Monitor blood pressure regularly. - Check kidney function. - Check vitamin D  levels.   Orders: -     amLODIPine  Besylate; Take 1 tablet (5 mg total) by mouth daily.   Dispense: 90 tablet; Refill: 1 -     Spironolactone ; Take 0.5 tablets (12.5 mg total) by mouth daily.  Dispense: 45 tablet; Refill: 1 -     BMP8+eGFR; Future  Influenza vaccination declined Assessment & Plan: Patient declined influenza vaccination at this time. Patient is aware that influenza vaccine prevents illness in 70% of healthy people, and reduces hospitalizations to 30-70% in elderly. This vaccine is recommended annually. Education has been provided regarding the importance of this vaccine but patient still declined. Advised may receive this vaccine at local pharmacy or Health Dept.or vaccine clinic. Aware to provide a copy of the vaccination record if obtained from local pharmacy or Health Dept.  Pt  is willing to accept risk associated with refusing vaccination.    COVID-19 vaccination declined Assessment & Plan: Declines covid 19 vaccine. Discussed risk of covid 41 and if he changes her mind about the vaccine to call the office. Education has been provided regarding the importance of this vaccine but patient still declined. Advised may receive this vaccine at local pharmacy or Health Dept.or vaccine clinic. Aware to provide a copy of the vaccination record if obtained from local pharmacy or Health Dept.  Encouraged to take multivitamin, vitamin d , vitamin c and zinc to increase immune system. Aware can call office if would like to have vaccine here at office. Verbalized acceptance and understanding.     Vitamin D  deficiency Assessment & Plan: Will check vitamin D  level and supplement as needed.    Also encouraged to spend 15 minutes in the sun daily.    Orders: -     VITAMIN D  25 Hydroxy (Vit-D Deficiency, Fractures); Future  Encounter for screening -     Hepatitis B surface antibody,qualitative; Future    Return for keep same next.  Patient was given opportunity to ask questions. Patient verbalized understanding of the plan and was able to repeat key elements of the plan.  All questions were answered to their satisfaction.    LILLETTE Gaines Ada, FNP, have reviewed all documentation for this visit. The documentation on 07/13/24 for the exam, diagnosis, procedures, and orders are all accurate and complete.   IF YOU HAVE BEEN REFERRED TO A SPECIALIST, IT MAY TAKE 1-2 WEEKS TO SCHEDULE/PROCESS THE REFERRAL. IF YOU HAVE NOT HEARD FROM US /SPECIALIST IN TWO WEEKS, PLEASE GIVE US  A CALL AT [phone].

## 2024-07-13 NOTE — Assessment & Plan Note (Signed)

## 2024-07-13 NOTE — Assessment & Plan Note (Addendum)
 Uncontrolled hypertension with inconsistent medication adherence. Blood pressure improved with lifestyle changes, but diastolic pressure remains slightly elevated. Discussed importance of controlling blood pressure to prevent cardiovascular complications. - Take half a tablet of spironolactone  daily (12.5 mg) - Continue taking 5 mg of amlodipine  daily. - Continue lifestyle modifications, including diet and exercise. - Monitor blood pressure regularly. - Check kidney function. - Check vitamin D  levels.

## 2024-07-13 NOTE — Assessment & Plan Note (Signed)

## 2024-07-13 NOTE — Assessment & Plan Note (Signed)
 Will check vitamin D  level and supplement as needed.    Also encouraged to spend 15 minutes in the sun daily.

## 2024-07-14 ENCOUNTER — Other Ambulatory Visit

## 2024-07-14 DIAGNOSIS — E559 Vitamin D deficiency, unspecified: Secondary | ICD-10-CM

## 2024-07-14 DIAGNOSIS — Z139 Encounter for screening, unspecified: Secondary | ICD-10-CM

## 2024-07-14 DIAGNOSIS — I10 Essential (primary) hypertension: Secondary | ICD-10-CM

## 2024-07-15 LAB — VITAMIN D 25 HYDROXY (VIT D DEFICIENCY, FRACTURES): Vit D, 25-Hydroxy: 23.6 ng/mL — ABNORMAL LOW (ref 30.0–100.0)

## 2024-07-15 LAB — BMP8+EGFR
BUN/Creatinine Ratio: 9 (ref 9–20)
BUN: 11 mg/dL (ref 6–24)
CO2: 21 mmol/L (ref 20–29)
Calcium: 9.1 mg/dL (ref 8.7–10.2)
Chloride: 104 mmol/L (ref 96–106)
Creatinine, Ser: 1.23 mg/dL (ref 0.76–1.27)
Glucose: 113 mg/dL — ABNORMAL HIGH (ref 70–99)
Potassium: 4.2 mmol/L (ref 3.5–5.2)
Sodium: 141 mmol/L (ref 134–144)
eGFR: 72 mL/min/1.73 (ref >59)

## 2024-07-15 LAB — HEPATITIS B SURFACE ANTIBODY,QUALITATIVE: Hep B Surface Ab, Qual: NONREACTIVE

## 2024-07-20 ENCOUNTER — Other Ambulatory Visit: Payer: Self-pay

## 2024-07-20 DIAGNOSIS — E559 Vitamin D deficiency, unspecified: Secondary | ICD-10-CM

## 2024-07-20 MED ORDER — VITAMIN D (ERGOCALCIFEROL) 1.25 MG (50000 UNIT) PO CAPS: 50000.0000 [IU] | ORAL_CAPSULE | ORAL | 1 refills | Status: AC

## 2024-07-21 ENCOUNTER — Ambulatory Visit: Payer: Self-pay | Admitting: Nurse Practitioner

## 2024-08-25 ENCOUNTER — Other Ambulatory Visit: Payer: Self-pay | Admitting: Nurse Practitioner

## 2024-08-25 DIAGNOSIS — I10 Essential (primary) hypertension: Secondary | ICD-10-CM

## 2024-09-14 ENCOUNTER — Encounter: Payer: Self-pay | Admitting: Nurse Practitioner

## 2024-11-13 ENCOUNTER — Encounter: Payer: Self-pay | Admitting: Nurse Practitioner

## 2024-12-07 ENCOUNTER — Encounter: Payer: 59 | Admitting: Nurse Practitioner

## 2025-03-09 ENCOUNTER — Encounter: Payer: Self-pay | Admitting: Nurse Practitioner
